# Patient Record
Sex: Male | Born: 1965 | Race: White | Hispanic: No | Marital: Single | Smoking: Former smoker
Health system: Southern US, Community
[De-identification: ages and names within clinical notes are randomized; demographics above are authoritative.]

## PROBLEM LIST (undated history)

## (undated) DIAGNOSIS — R600 Localized edema: Secondary | ICD-10-CM

## (undated) DIAGNOSIS — T8859XA Other complications of anesthesia, initial encounter: Secondary | ICD-10-CM

## (undated) DIAGNOSIS — T4145XA Adverse effect of unspecified anesthetic, initial encounter: Secondary | ICD-10-CM

## (undated) DIAGNOSIS — N529 Male erectile dysfunction, unspecified: Secondary | ICD-10-CM

## (undated) HISTORY — PX: CERVICAL SPINE SURGERY: SHX589

## (undated) HISTORY — DX: Male erectile dysfunction, unspecified: N52.9

## (undated) HISTORY — DX: Localized edema: R60.0

## (undated) HISTORY — PX: HERNIA REPAIR: SHX51

---

## 2004-02-07 ENCOUNTER — Ambulatory Visit: Payer: Self-pay | Admitting: Orthopedic Surgery

## 2004-03-07 ENCOUNTER — Ambulatory Visit (HOSPITAL_COMMUNITY): Admission: RE | Admit: 2004-03-07 | Discharge: 2004-03-08 | Payer: Self-pay | Admitting: Neurological Surgery

## 2004-04-02 ENCOUNTER — Encounter: Admission: RE | Admit: 2004-04-02 | Discharge: 2004-04-02 | Payer: Self-pay | Admitting: Neurological Surgery

## 2005-08-27 IMAGING — CR DG CHEST 2V
2 series · 2 of 2 positions shown · non-contrast
Comparison: none

CLINICAL DATA: 39-year-old with HNP.  Pre-op evaluation.    

 CHEST (TWO VIEWS):
 No prior exams are available for comparison.  
 The heart size and mediastinal contours are normal. The lungs are clear. The visualized skeleton is unremarkable.

[view not recorded (1 of 2)]
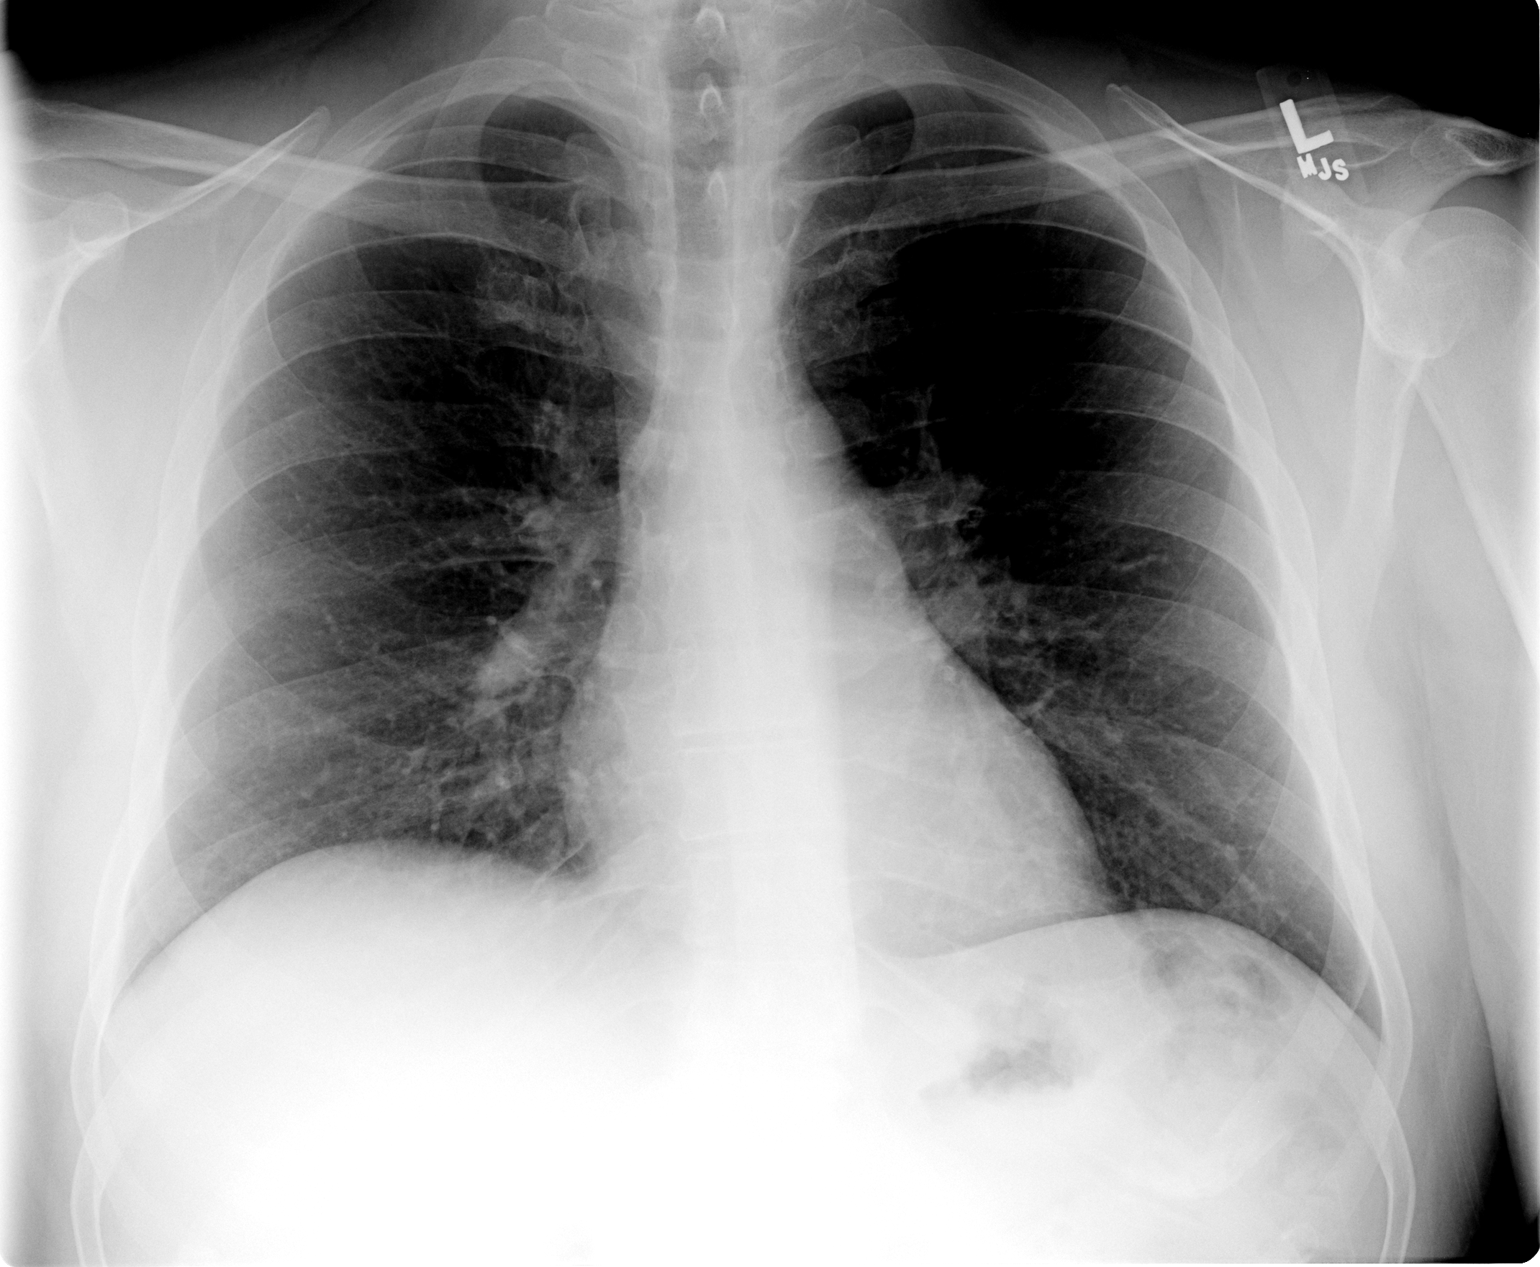

[view not recorded (2 of 2)]
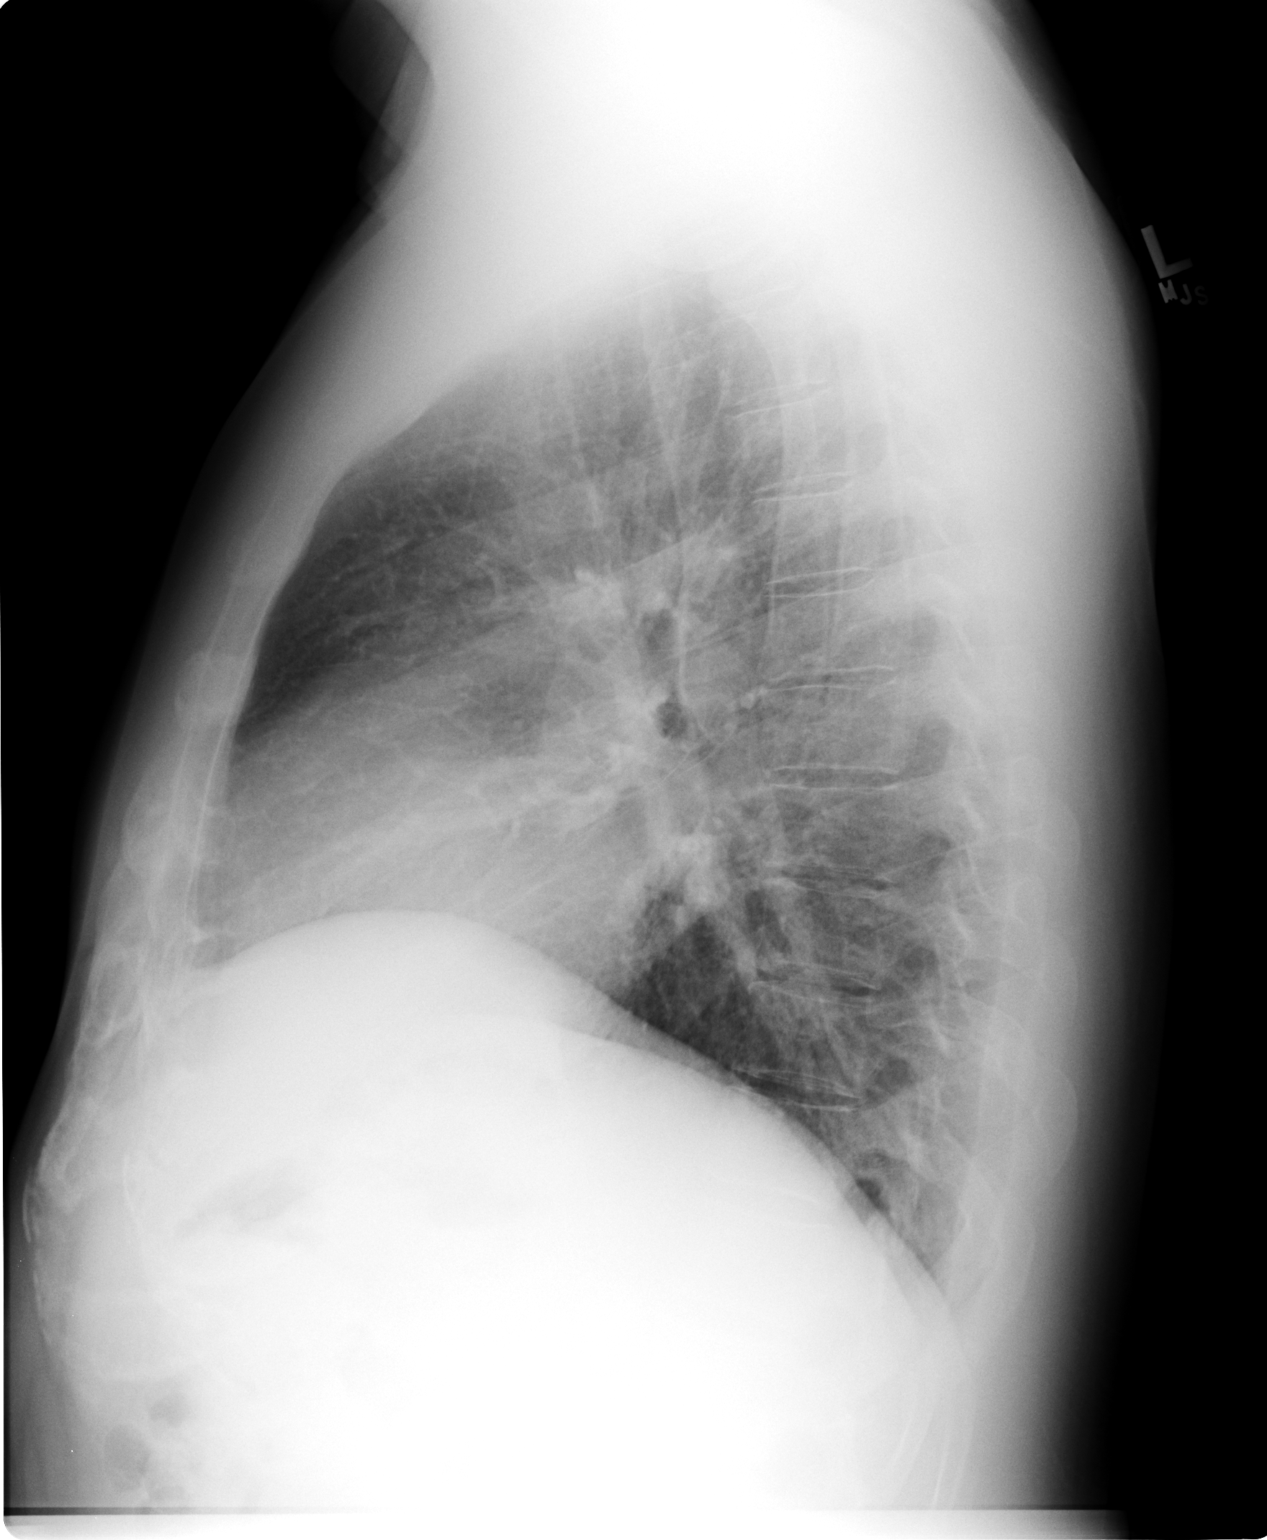

[2 of 2 positions shown; findings below may reference images not displayed]

IMPRESSION: No active disease.

## 2005-09-24 IMAGING — CR DG CERVICAL SPINE 1V
1 series · 1 of 1 positions shown · non-contrast
Comparison: none

CLINICAL DATA: Status post ACDF C5-6. 
 SINGLE VIEW CERVICAL SPINE: 
 Single lateral view of the cervical spine demonstrate anterior plate and screw fixation and interbody fusion at C5-6 with normal alignment.  There appears to be some bony incorporation of the bony fusion anteriorly.  No complicating features identified.

[view not recorded]
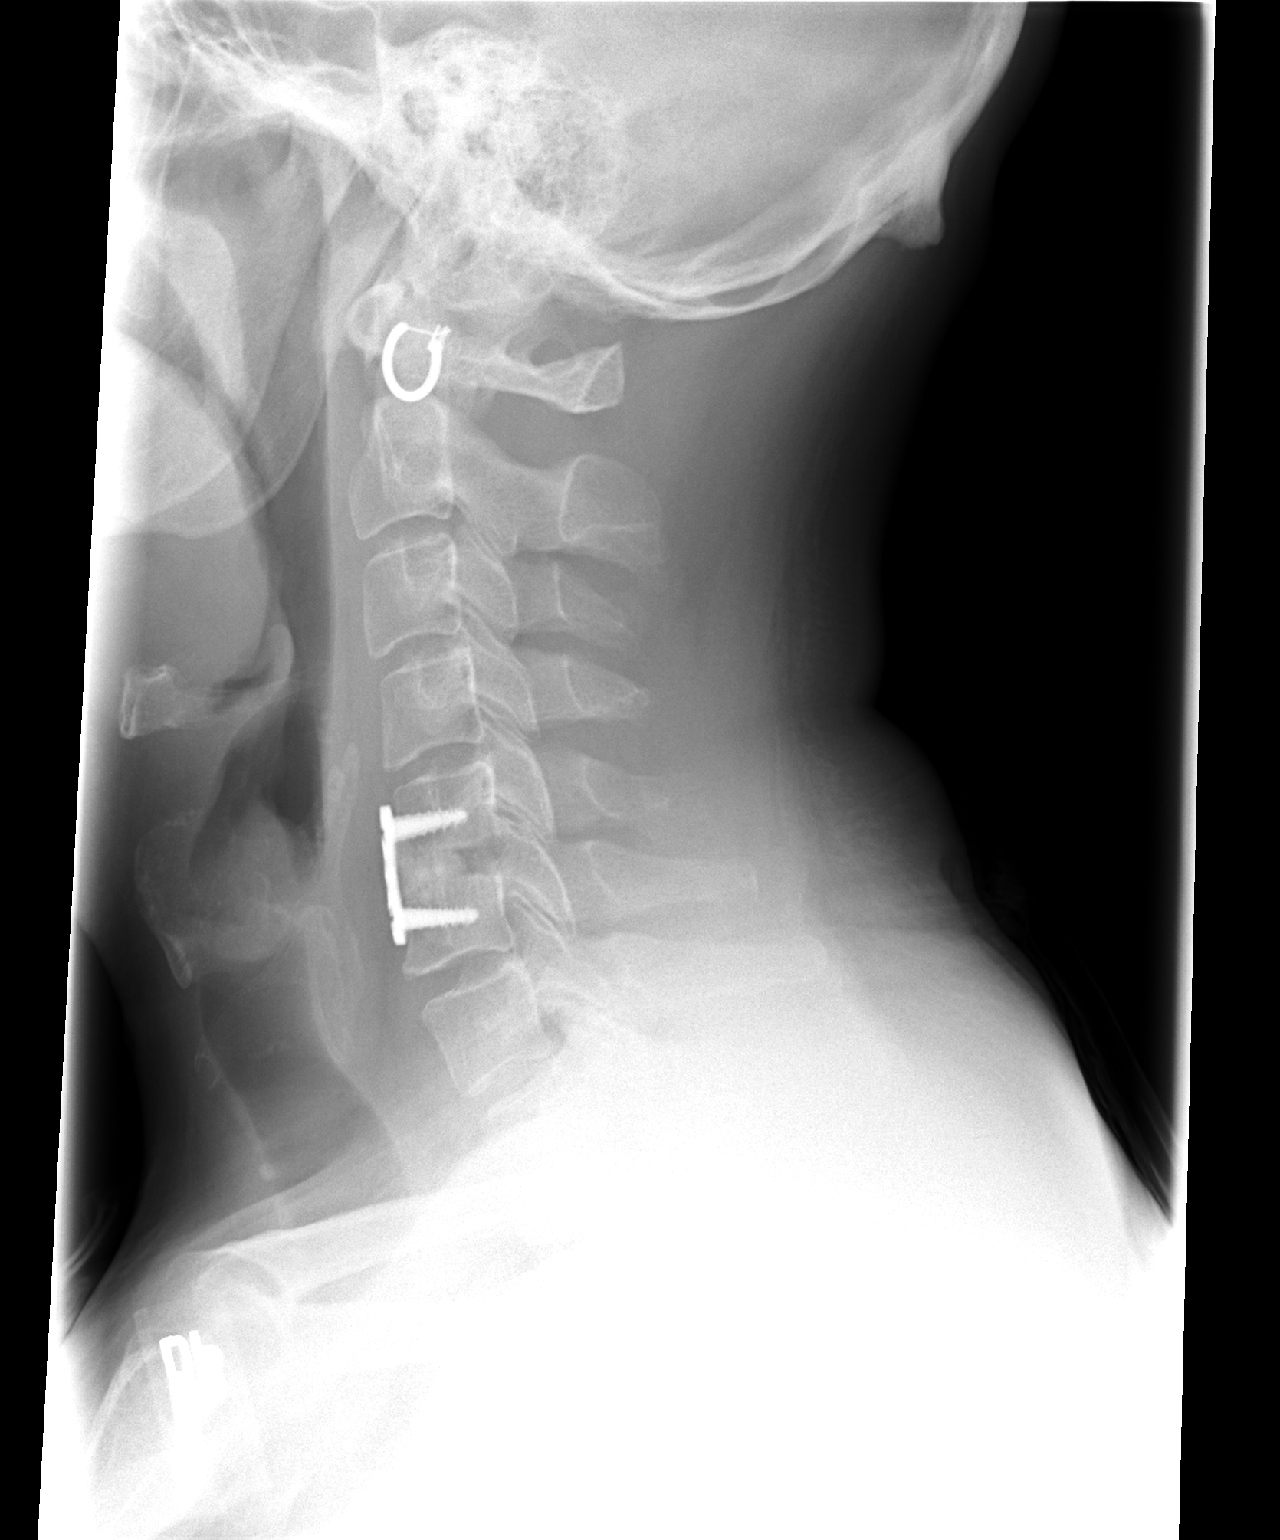

[1 of 1 positions shown; findings below may reference images not displayed]

IMPRESSION: Anterior fusion C5-6 without complicating features.

## 2014-09-01 ENCOUNTER — Other Ambulatory Visit: Payer: Self-pay | Admitting: Family Medicine

## 2014-09-01 ENCOUNTER — Telehealth: Payer: Self-pay | Admitting: Family Medicine

## 2014-09-01 DIAGNOSIS — A6000 Herpesviral infection of urogenital system, unspecified: Secondary | ICD-10-CM

## 2014-09-01 DIAGNOSIS — N529 Male erectile dysfunction, unspecified: Secondary | ICD-10-CM

## 2014-09-01 MED ORDER — TADALAFIL 20 MG PO TABS: 20.0000 mg | ORAL_TABLET | Freq: Every day | ORAL | Status: AC | PRN

## 2014-09-01 MED ORDER — VALACYCLOVIR HCL 500 MG PO TABS: 500.0000 mg | ORAL_TABLET | Freq: Two times a day (BID) | ORAL | Status: DC

## 2014-09-01 NOTE — Telephone Encounter (Signed)
Needs new refills for Valtrex and Cialis.  Uses [facility] .

## 2014-09-01 NOTE — Telephone Encounter (Signed)
review 

## 2014-09-12 ENCOUNTER — Encounter: Payer: Self-pay | Admitting: Family Medicine

## 2014-09-13 ENCOUNTER — Ambulatory Visit (INDEPENDENT_AMBULATORY_CARE_PROVIDER_SITE_OTHER): Payer: BLUE CROSS/BLUE SHIELD | Admitting: Family Medicine

## 2014-09-13 ENCOUNTER — Encounter: Payer: Self-pay | Admitting: Family Medicine

## 2014-09-13 VITALS — BP 124/80 | HR 73 | Temp 98.4°F | Resp 16 | Ht 72.0 in | Wt 270.6 lb

## 2014-09-13 DIAGNOSIS — R635 Abnormal weight gain: Secondary | ICD-10-CM | POA: Diagnosis not present

## 2014-09-13 DIAGNOSIS — Z0000 Encounter for general adult medical examination without abnormal findings: Secondary | ICD-10-CM

## 2014-09-13 NOTE — Progress Notes (Signed)
Subjective:     Patient ID: Shaun Hanson, male   DOB: 11/24/1965, 49 y.o.   MRN: [id number]  HPI  Chief Complaint  Patient presents with  . Annual Exam    Patient is here for his annual physical he would like to address weight gain over the past year and to discuss his liver function. Patient reports that he use to be a heavy drinker drinking up to 20oz a day and has now gone down to 1 drink on weekends. 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 He is planning to buy a dump truck and start a new business.   Review of Systems General: Feeling well ; Defers Tdap. Concerned about weight gain (20# over the last year) HEENT: first dental visit in several years pending due to "broken tooth". Cardiovascular: no chest pain, shortness of breath, or palpitations. Still gets intermittent leg swelling from venous insufficiency but much less since he quit drinking heavily a year ago. GI:  Heartburn 1-2 x weeks treated with TUMS. No change in bowel habits or blood in the stool. Feels bloated at times concerned he may have chronic liver problems. GU: nocturia x 0, no change in bladder habits. Uses Cialis occasionally for erectile dysfunction. 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 Psychiatric: not depressed-PHQ-2: 0 Musculoskeletal: no joint pain    Objective:   Physical Exam Eyes: PERRLA, EOMI. Swelling noted below lower eyelids. Neck: no thyromegaly, tenderness or nodules, carotids palpable-no bruits, no cervical adenopathy ENT: TM's intact without inflammation; No tonsillar enlargement or exudate, Lungs: Clear Heart : RRR without murmur or gallop Abd: bowel sounds present, soft, non-tender, no organomegaly Rectal: prostate exam not attempted due to patient's body  habitus Extremities: no edema Skin: no atypical lesions noted.    Assessment:    1. Annual physical exam - Lipid panel - Comprehensive metabolic panel - PSA - Ambulatory referral to Gastroenterology  2. Increased body weight - T4, free - TSH    Plan:    Further f/u pending lab work

## 2014-09-13 NOTE — Patient Instructions (Signed)
We will call you with lab results.       

## 2014-09-14 ENCOUNTER — Telehealth: Payer: Self-pay

## 2014-09-14 LAB — LIPID PANEL
CHOL/HDL RATIO: 7.2 ratio — AB (ref 0.0–5.0)
Cholesterol, Total: 236 mg/dL — ABNORMAL HIGH (ref 100–199)
HDL: 33 mg/dL — AB (ref >39)
LDL CALC: 157 mg/dL — AB (ref 0–99)
TRIGLYCERIDES: 231 mg/dL — AB (ref 0–149)
VLDL CHOLESTEROL CAL: 46 mg/dL — AB (ref 5–40)

## 2014-09-14 LAB — COMPREHENSIVE METABOLIC PANEL
ALBUMIN: 4.2 g/dL (ref 3.5–5.5)
ALT: 70 IU/L — ABNORMAL HIGH (ref 0–44)
AST: 34 IU/L (ref 0–40)
Albumin/Globulin Ratio: 1.6 (ref 1.1–2.5)
Alkaline Phosphatase: 62 IU/L (ref 39–117)
BUN / CREAT RATIO: 28 — AB (ref 9–20)
BUN: 23 mg/dL (ref 6–24)
Bilirubin Total: 0.8 mg/dL (ref 0.0–1.2)
CHLORIDE: 102 mmol/L (ref 97–108)
CO2: 21 mmol/L (ref 18–29)
Calcium: 9.5 mg/dL (ref 8.7–10.2)
Creatinine, Ser: 0.83 mg/dL (ref 0.76–1.27)
GFR calc Af Amer: 119 mL/min/{1.73_m2} (ref >59)
GFR calc non Af Amer: 103 mL/min/{1.73_m2} (ref >59)
GLUCOSE: 99 mg/dL (ref 65–99)
Globulin, Total: 2.7 g/dL (ref 1.5–4.5)
POTASSIUM: 4.6 mmol/L (ref 3.5–5.2)
SODIUM: 140 mmol/L (ref 134–144)
Total Protein: 6.9 g/dL (ref 6.0–8.5)

## 2014-09-14 LAB — TSH: TSH: 2.56 u[IU]/mL (ref 0.450–4.500)

## 2014-09-14 LAB — T4, FREE: FREE T4: 1.21 ng/dL (ref 0.82–1.77)

## 2014-09-14 LAB — PSA: PROSTATE SPECIFIC AG, SERUM: 0.2 ng/mL (ref 0.0–4.0)

## 2014-09-14 NOTE — Telephone Encounter (Signed)
-----   Message from Carmon Ginsberg, [place] sent at 09/14/2014 12:37 PM EDT ----- Cholesterol is elevated but your calculated 10 year risk for developing cardiovascular disease is 6.4 % below the 7.5 % thresh hold for starting medication. One of your liver tests remains mildly elevated from last year. If we have not performed an ultrasound to check your liver would recommend this due to your stated concerns about your prior alcohol use.. All your other labs are ok including thyroid and prostate blood test.

## 2014-09-14 NOTE — Telephone Encounter (Signed)
[id number] 

## 2014-09-21 NOTE — Telephone Encounter (Signed)
No medication I am aware of to repair liver. Avoid alcohol, tylenol, and minimal use of ibuprofen or similar.

## 2014-09-21 NOTE — Telephone Encounter (Signed)
Ultrasound may help detect cirrhosis or liver enlargement. Request for colonoscopy was sent at time of his visit. Our referral person was out for a week so it still catching up.

## 2014-09-21 NOTE — Telephone Encounter (Signed)
Spoke with patient on the phone who states in past his liver was enlarged he believes that it probably is still enlarged and feels no need at this time to have ultrasound done. Patient wants to know if there is a medication that can be prescribed to help repair liver?

## 2014-09-21 NOTE — Telephone Encounter (Signed)
Patient was advised, he believes that if he stays on current regimen of decreasing alcohol that his liver function will return back to normal. Advised patient that ultrasound is recommend, patient request more information as to what would you exactly be looking for when and if he decides to get ultrasound? Patient also inquired if colonoscopy order has been placed?

## 2014-09-21 NOTE — Telephone Encounter (Signed)
Okay per patient to leave detailed voicemail, left message stating to avoid alcohol, tylenol and minimal use of ibuprofen. KW

## 2014-09-26 ENCOUNTER — Telehealth: Payer: Self-pay | Admitting: Gastroenterology

## 2014-09-26 ENCOUNTER — Other Ambulatory Visit: Payer: Self-pay

## 2014-09-26 NOTE — Telephone Encounter (Signed)
Gastroenterology Pre-Procedure Review  Request Date: 10-10-2014 Requesting Physician: Dr.   PATIENT REVIEW QUESTIONS: The patient responded to the following health history questions as indicated:    1. Are you having any GI issues? no 2. Do you have a personal history of Polyps? no 3. Do you have a family history of Colon Cancer or Polyps? no 4. Diabetes Mellitus? no 5. Joint replacements in the past 12 months?no 6. Major health problems in the past 3 months?no 7. Any artificial heart valves, MVP, or defibrillator?no    MEDICATIONS & ALLERGIES:    Patient reports the following regarding taking any anticoagulation/antiplatelet therapy:   Plavix, Coumadin, Eliquis, Xarelto, Lovenox, Pradaxa, Brilinta, or Effient? no Aspirin? no  Patient confirms/reports the following medications:  Current Outpatient Prescriptions  Medication Sig Dispense Refill   tadalafil (CIALIS) 20 MG tablet Take 1 tablet (20 mg total) by mouth daily as needed for erectile dysfunction. 6 tablet 11   valACYclovir (VALTREX) 500 MG tablet Take 1 tablet (500 mg total) by mouth 2 (two) times daily. As needed for flares 30 tablet 2   No current facility-administered medications for this visit.    Patient confirms/reports the following allergies: N/A No Known Allergies  No orders of the defined types were placed in this encounter.    AUTHORIZATION INFORMATION Primary Insurance: 1D#: Group #:  Secondary Insurance: 1D#: Group #:  SCHEDULE INFORMATION: Date: 10-10-2014 Time: Location:[facility]

## 2014-10-07 NOTE — Discharge Instructions (Signed)

## 2014-10-10 ENCOUNTER — Ambulatory Visit: Payer: BLUE CROSS/BLUE SHIELD | Admitting: Anesthesiology

## 2014-10-10 ENCOUNTER — Encounter
Admission: RE | Disposition: A | Discharge status: Home / Self Care | Payer: Self-pay | Source: Ambulatory Visit | Attending: Gastroenterology

## 2014-10-10 ENCOUNTER — Ambulatory Visit
Admission: RE | Admit: 2014-10-10 | Discharge: 2014-10-10 | Disposition: A | Discharge status: Home / Self Care | Payer: BLUE CROSS/BLUE SHIELD | Source: Ambulatory Visit | Attending: Gastroenterology | Admitting: Gastroenterology

## 2014-10-10 ENCOUNTER — Other Ambulatory Visit: Payer: Self-pay | Admitting: Gastroenterology

## 2014-10-10 DIAGNOSIS — Z79899 Other long term (current) drug therapy: Secondary | ICD-10-CM | POA: Diagnosis not present

## 2014-10-10 DIAGNOSIS — N529 Male erectile dysfunction, unspecified: Secondary | ICD-10-CM | POA: Insufficient documentation

## 2014-10-10 DIAGNOSIS — E669 Obesity, unspecified: Secondary | ICD-10-CM | POA: Insufficient documentation

## 2014-10-10 DIAGNOSIS — Z9889 Other specified postprocedural states: Secondary | ICD-10-CM | POA: Insufficient documentation

## 2014-10-10 DIAGNOSIS — Z1211 Encounter for screening for malignant neoplasm of colon: Secondary | ICD-10-CM | POA: Insufficient documentation

## 2014-10-10 DIAGNOSIS — D123 Benign neoplasm of transverse colon: Secondary | ICD-10-CM | POA: Insufficient documentation

## 2014-10-10 DIAGNOSIS — Z6835 Body mass index (BMI) 35.0-35.9, adult: Secondary | ICD-10-CM | POA: Diagnosis not present

## 2014-10-10 HISTORY — PX: POLYPECTOMY: SHX5525

## 2014-10-10 HISTORY — DX: Adverse effect of unspecified anesthetic, initial encounter: T41.45XA

## 2014-10-10 HISTORY — DX: Other complications of anesthesia, initial encounter: T88.59XA

## 2014-10-10 HISTORY — PX: COLONOSCOPY WITH PROPOFOL: SHX5780

## 2014-10-10 SURGERY — COLONOSCOPY WITH PROPOFOL
Anesthesia: Monitor Anesthesia Care | Wound class: Contaminated

## 2014-10-10 MED ORDER — ACETAMINOPHEN 325 MG PO TABS: 325.0000 mg | ORAL_TABLET | ORAL | Status: DC | PRN

## 2014-10-10 MED ORDER — LIDOCAINE HCL (CARDIAC) 20 MG/ML IV SOLN
INTRAVENOUS | Status: DC | PRN
  Administered 2014-10-10: 40 mg via INTRAVENOUS

## 2014-10-10 MED ORDER — ACETAMINOPHEN 160 MG/5ML PO SOLN: 325.0000 mg | ORAL | Status: DC | PRN

## 2014-10-10 MED ORDER — LACTATED RINGERS IV SOLN
INTRAVENOUS | Status: DC
  Administered 2014-10-10: 10:00:00 via INTRAVENOUS

## 2014-10-10 MED ORDER — SODIUM CHLORIDE 0.9 % IV SOLN: INTRAVENOUS | Status: DC

## 2014-10-10 MED ORDER — PROPOFOL 10 MG/ML IV BOLUS
INTRAVENOUS | Status: DC | PRN
  Administered 2014-10-10: 100 mg via INTRAVENOUS
  Administered 2014-10-10 (×2): 50 mg via INTRAVENOUS
  Administered 2014-10-10: 100 mg via INTRAVENOUS

## 2014-10-10 SURGICAL SUPPLY — 28 items
CANISTER SUCT 1200ML W/VALVE (MISCELLANEOUS) ×3 IMPLANT
FCP ESCP3.2XJMB 240X2.8X (MISCELLANEOUS)
FORCEPS BIOP RAD 4 LRG CAP 4 (CUTTING FORCEPS) ×3 IMPLANT
FORCEPS BIOP RJ4 240 W/NDL (MISCELLANEOUS)
FORCEPS ESCP3.2XJMB 240X2.8X (MISCELLANEOUS) IMPLANT
GOWN CVR UNV OPN BCK APRN NK (MISCELLANEOUS) ×2 IMPLANT
GOWN ISOL THUMB LOOP REG UNIV (MISCELLANEOUS) ×4
HEMOCLIP INSTINCT (CLIP) IMPLANT
INJECTOR VARIJECT VIN23 (MISCELLANEOUS) IMPLANT
KIT CO2 TUBING (TUBING) IMPLANT
KIT DEFENDO VALVE AND CONN (KITS) IMPLANT
KIT ENDO PROCEDURE OLY (KITS) ×3 IMPLANT
LIGATOR MULTIBAND 6SHOOTER MBL (MISCELLANEOUS) IMPLANT
MARKER SPOT ENDO TATTOO 5ML (MISCELLANEOUS) IMPLANT
PAD GROUND ADULT SPLIT (MISCELLANEOUS) IMPLANT
SNARE SHORT THROW 13M SML OVAL (MISCELLANEOUS) IMPLANT
SNARE SHORT THROW 30M LRG OVAL (MISCELLANEOUS) IMPLANT
SPOT EX ENDOSCOPIC TATTOO (MISCELLANEOUS)
SUCTION POLY TRAP 4CHAMBER (MISCELLANEOUS) IMPLANT
TRAP SUCTION POLY (MISCELLANEOUS) IMPLANT
TUBING CONN 6MMX3.1M (TUBING)
TUBING SUCTION CONN 0.25 STRL (TUBING) IMPLANT
UNDERPAD 30X60 958B10 (PK) (MISCELLANEOUS) IMPLANT
VALVE BIOPSY ENDO (VALVE) IMPLANT
VARIJECT INJECTOR VIN23 (MISCELLANEOUS)
WATER AUXILLARY (MISCELLANEOUS) IMPLANT
WATER STERILE IRR 250ML POUR (IV SOLUTION) ×3 IMPLANT
WATER STERILE IRR 500ML POUR (IV SOLUTION) IMPLANT

## 2014-10-10 NOTE — Op Note (Signed)
[facility]astroenter[facility] Patient Name: Shaun Hanson Procedure Date: 10/10/2014 11:29 AM MRN: [id number] Account #: [id number] Date of Birth: 03/10/1965 Admit Type: Outpatient Age: 49 Room: [facility] OR ROOM 01 Gender: Male Note Status: Finalized Procedure:         Colonoscopy Indications:       Screening for colorectal malignant neoplasm Providers:         Lucilla Lame, MD Referring MD:      Rose Fillers. Jaynie Crumble, MD (Referring MD) Medicines:         Propofol per Anesthesia Complications:     No immediate complications. Procedure:         Pre-Anesthesia Assessment:                    - Prior to the procedure, a History and Physical was                     performed, and patient medications and allergies were                     reviewed. The patient's tolerance of previous anesthesia                     was also reviewed. The risks and benefits of the procedure                     and the sedation options and risks were discussed with the                     patient. All questions were answered, and informed consent                     was obtained. Prior Anticoagulants: The patient has taken                     no previous anticoagulant or antiplatelet agents. ASA                     Grade Assessment: II - A patient with mild systemic                     disease. After reviewing the risks and benefits, the                     patient was deemed in satisfactory condition to undergo                     the procedure.                    After obtaining informed consent, the colonoscope was                     passed under direct vision. Throughout the procedure, the                     patient's blood pressure, pulse, and oxygen saturations                     were monitored continuously. The Olympus CF H180AL                     colonoscope (S#: [id number]) was introduced through the anus  and advanced to the the cecum, identified by appendiceal            orifice and ileocecal valve. The colonoscopy was performed                     without difficulty. The patient tolerated the procedure                     well. The quality of the bowel preparation was excellent. Findings:      The perianal and digital rectal examinations were normal.      A 5 mm polyp was found in the transverse colon. The polyp was sessile.       The polyp was removed with a cold biopsy forceps. Resection and       retrieval were complete.      The exam was otherwise without abnormality. Impression:        - One 5 mm polyp in the transverse colon. Resected and                     retrieved.                    - The examination was otherwise normal. Recommendation:    - Repeat colonoscopy in 5 years if polyp adenoma and 10                     years if hyperplastic Procedure Code(s): --- Professional ---                    [phone], Colonoscopy, flexible; with biopsy, single or                     multiple Diagnosis Code(s): --- Professional ---                    Z12.11, Encounter for screening for malignant neoplasm of                     colon                    D12.3, Benign neoplasm of transverse colon CPT copyright 2014 American Medical Association. All rights reserved. The codes documented in this report are preliminary and upon coder review may  be revised to meet current compliance requirements. Lucilla Lame, MD 10/10/2014 11:45:06 AM This report has been signed electronically. Number of Addenda: 0 Note Initiated On: 10/10/2014 11:29 AM Scope Withdrawal Time: 0 hours 4 minutes 49 seconds  Total Procedure Duration: 0 hours 8 minutes 35 seconds       [facility]

## 2014-10-10 NOTE — Transfer of Care (Signed)
Immediate Anesthesia Transfer of Care Note  Patient: Shaun Hanson  Procedure(s) Performed: Procedure(s) with comments: COLONOSCOPY WITH PROPOFOL (N/A) POLYPECTOMY (N/A) - transverse colon polyp  Patient Location: PACU  Anesthesia Type: MAC  Level of Consciousness: awake, alert  and patient cooperative  Airway and Oxygen Therapy: Patient Spontanous Breathing and Patient connected to supplemental oxygen  Post-op Assessment: Post-op Vital signs reviewed, Patient's Cardiovascular Status Stable, Respiratory Function Stable, Patent Airway and No signs of Nausea or vomiting  Post-op Vital Signs: Reviewed and stable  Complications: No apparent anesthesia complications

## 2014-10-10 NOTE — Anesthesia Postprocedure Evaluation (Signed)
  Anesthesia Post-op Note  Patient: Shaun Hanson  Procedure(s) Performed: Procedure(s) with comments: COLONOSCOPY WITH PROPOFOL (N/A) POLYPECTOMY (N/A) - transverse colon polyp  Anesthesia type:MAC  Patient location: PACU  Post pain: Pain level controlled  Post assessment: Post-op Vital signs reviewed, Patient's Cardiovascular Status Stable, Respiratory Function Stable, Patent Airway and No signs of Nausea or vomiting  Post vital signs: Reviewed and stable  Last Vitals:  Filed Vitals:   10/10/14 1200  BP: 104/89  Pulse: 71  Temp:   Resp: 16    Level of consciousness: awake, alert  and patient cooperative  Complications: No apparent anesthesia complications

## 2014-10-10 NOTE — Anesthesia Preprocedure Evaluation (Signed)
Anesthesia Evaluation  Patient identified by MRN, date of birth, ID band  Reviewed: Allergy & Precautions, H&P , NPO status , Patient's Chart, lab work & pertinent test results  Airway Mallampati: II  TM Distance: >3 FB Neck ROM: full    Dental no notable dental hx.    Pulmonary former smoker,    Pulmonary exam normal       Cardiovascular Rhythm:regular Rate:Normal     Neuro/Psych    GI/Hepatic   Endo/Other    Renal/GU      Musculoskeletal   Abdominal (+) + obese,   Peds  Hematology   Anesthesia Other Findings   Reproductive/Obstetrics                             Anesthesia Physical Anesthesia Plan  ASA: II  Anesthesia Plan: MAC   Post-op Pain Management:    Induction:   Airway Management Planned:   Additional Equipment:   Intra-op Plan:   Post-operative Plan:   Informed Consent: I have reviewed the patients History and Physical, chart, labs and discussed the procedure including the risks, benefits and alternatives for the proposed anesthesia with the patient or authorized representative who has indicated his/her understanding and acceptance.     Plan Discussed with: CRNA  Anesthesia Plan Comments:         Anesthesia Quick Evaluation

## 2014-10-10 NOTE — Anesthesia Procedure Notes (Signed)
Procedure Name: MAC Performed by: Rudolf Blizard Pre-anesthesia Checklist: Patient identified, Emergency Drugs available, Suction available, Timeout performed and Patient being monitored Patient Re-evaluated:Patient Re-evaluated prior to inductionOxygen Delivery Method: Nasal cannula Placement Confirmation: positive ETCO2     

## 2014-10-10 NOTE — H&P (Signed)
  [facility]  [address], [place], [place] [address] Phone: [phone] Fax : [phone]  Primary Care Physician:  Carmon Ginsberg, [place] Primary Gastroenterologist:  Dr. Allen Norris  Pre-Procedure History & Physical: HPI:  Shaun Hanson is a 49 y.o. male is here for a screening colonoscopy.   Past Medical History  Diagnosis Date  . Erectile dysfunction   . Leg edema, right   . Complication of anesthesia     WOKE UP DURING HERNIA SURGERY    Past Surgical History  Procedure Laterality Date  . Cervical spine surgery    . Hernia repair      UMBILICAL SURGERY    Prior to Admission medications   Medication Sig Start Date End Date Taking? Authorizing Provider  tadalafil (CIALIS) 20 MG tablet Take 1 tablet (20 mg total) by mouth daily as needed for erectile dysfunction. 09/01/14  Yes Carmon Ginsberg, PA  valACYclovir (VALTREX) 500 MG tablet Take 1 tablet (500 mg total) by mouth 2 (two) times daily. As needed for flares 09/01/14  Yes Carmon Ginsberg, [place]    Allergies as of 09/26/2014  . (No Known Allergies)    History reviewed. No pertinent family history.  Social History   Social History  . Marital Status: Divorced    Spouse Name: N/A  . Number of Children: N/A  . Years of Education: N/A   Occupational History  . Not on file.   Social History Main Topics  . Smoking status: Former Smoker -- 0.50 packs/day for 10 years    Types: Cigarettes  . Smokeless tobacco: Not on file  . Alcohol Use: 0.0 oz/week    0 Standard drinks or equivalent per week     Comment: past history of heavy alcohol use, patient was on 20oz a night and is now to a drink over weekend.   . Drug Use: Not on file  . Sexual Activity: Not on file   Other Topics Concern  . Not on file   Social History Narrative    Review of Systems: See HPI, otherwise negative ROS  Physical Exam: BP 115/77 mmHg  Pulse 63  Temp(Src) 98.2 F (36.8 C) (Temporal)  Resp 16  Ht 6' (1.829 m)  Wt 265  lb (120.203 kg)  BMI 35.93 kg/m2  SpO2 98% General:   Alert,  pleasant and cooperative in NAD Head:  Normocephalic and atraumatic. Neck:  Supple; no masses or thyromegaly. Lungs:  Clear throughout to auscultation.    Heart:  Regular rate and rhythm. Abdomen:  Soft, nontender and nondistended. Normal bowel sounds, without guarding, and without rebound.   Neurologic:  Alert and  oriented x4;  grossly normal neurologically.  Impression/Plan: Shaun Hanson is now here to undergo a screening colonoscopy.  Risks, benefits, and alternatives regarding colonoscopy have been reviewed with the patient.  Questions have been answered.  All parties agreeable.

## 2014-10-11 ENCOUNTER — Encounter: Payer: Self-pay | Admitting: Gastroenterology

## 2014-10-17 ENCOUNTER — Encounter: Payer: Self-pay | Admitting: Gastroenterology

## 2014-10-21 ENCOUNTER — Telehealth: Payer: Self-pay | Admitting: Family Medicine

## 2014-10-21 NOTE — Telephone Encounter (Signed)
Could protein in the urine factor in to lower back pain?

## 2014-10-21 NOTE — Telephone Encounter (Signed)
Discussed office visit next week to repeat urine.

## 2014-10-21 NOTE — Telephone Encounter (Signed)
Having a little dull pain in his right kidney.  He said we told him he had some protein in his urine when he was in and he doesn't know if this is related or not  He said he could not come in today.  His call back   [phone].  Thanks [employer]

## 2015-02-21 ENCOUNTER — Other Ambulatory Visit: Payer: Self-pay | Admitting: Family Medicine

## 2015-03-20 ENCOUNTER — Other Ambulatory Visit: Payer: Self-pay | Admitting: Family Medicine

## 2015-05-21 ENCOUNTER — Other Ambulatory Visit: Payer: Self-pay | Admitting: Family Medicine

## 2023-12-02 ENCOUNTER — Encounter: Payer: Self-pay | Admitting: Dermatology

## 2023-12-02 ENCOUNTER — Ambulatory Visit: Admitting: Dermatology

## 2023-12-02 DIAGNOSIS — S0001XA Abrasion of scalp, initial encounter: Secondary | ICD-10-CM | POA: Diagnosis not present

## 2023-12-02 DIAGNOSIS — T148XXA Other injury of unspecified body region, initial encounter: Secondary | ICD-10-CM

## 2023-12-02 DIAGNOSIS — R238 Other skin changes: Secondary | ICD-10-CM

## 2023-12-02 DIAGNOSIS — L739 Follicular disorder, unspecified: Secondary | ICD-10-CM

## 2023-12-02 MED ORDER — DOXYCYCLINE MONOHYDRATE 100 MG PO TABS: 100.0000 mg | ORAL_TABLET | Freq: Two times a day (BID) | ORAL | 0 refills | Status: DC

## 2023-12-02 NOTE — Progress Notes (Unsigned)
   Follow-Up Visit   Subjective  Shaun Hanson is a 58 y.o. male who presents for the following: Areas of concern on the scalp. Has been dx'd as folliculitis in the past. Not currently being treated. Lesions begin as bumps and then open up. Sometimes have oily drainage. Can last for a month. Has tried many topical treatments without success. Has not tried oral treatments   The following portions of the chart were reviewed this encounter and updated as appropriate: medications, allergies, medical history  Review of Systems:  No other skin or systemic complaints except as noted in HPI or Assessment and Plan.  Objective  Well appearing patient in no apparent distress; mood and affect are within normal limits.   A focused examination was performed of the following areas: Scalp.   Relevant exam findings are noted in the Assessment and Plan.    Assessment & Plan   Excoriated crusted papules with history concerning for folliculitis Exam: few scattered excoriated papules on the scalp face upper extremities  Folliculitis occurs due to inflammation of the superficial hair follicle (pore), resulting in acne-like lesions (pus bumps). It can be infectious (bacterial, fungal) or noninfectious (shaving, tight clothing, heat/sweat, medications).  Folliculitis can be acute or chronic and recommended treatment depends on the underlying cause of folliculitis.  Treatment Plan: - offered biopsy of lesion but current lesions are clinically suggestive of excoriation. Suspect biopsy would show trauma without a primary process. Patient opts for empiric treatment of folliculitis - Start doxycycline 100mg  twice daily for one month - Discussed side effects and precautions with doxycycline including taking with  meal, waiting at least 30 minutes before lying down at night, increased sun sensitivity, and to stop medication if becomes pregnant or breastfeeding - patient will contact us  for same day appointment if  new lesion develops. Discussed that it must be a lesion that has not yet opened up, else biopsy will likely show open wound   EXCORIATION   FOLLICULITIS   Related Medications doxycycline (ADOXA) 100 MG tablet Take 1 tablet (100 mg total) by mouth 2 (two) times daily. Take 100 mg twice daily for one month  Return in about 1 month (around 01/02/2024) for Folliculitis .  I, Emerick Ege, CMA am acting as scribe for Boneta Sharps, MD.   Documentation: I have reviewed the above documentation for accuracy and completeness, and I agree with the above.  Boneta Sharps, MD

## 2023-12-02 NOTE — Patient Instructions (Addendum)
 Doxycycline  should be taken with food to prevent nausea. Do not lay down for 30 minutes after taking. Be cautious with sun exposure and use good sun protection while on this medication. Pregnant women should not take this medication.    Due to recent changes in healthcare laws, you may see results of your pathology and/or laboratory studies on MyChart before the doctors have had a chance to review them. We understand that in some cases there may be results that are confusing or concerning to you. Please understand that not all results are received at the same time and often the doctors may need to interpret multiple results in order to provide you with the best plan of care or course of treatment. Therefore, we ask that you please give us  2 business days to thoroughly review all your results before contacting the office for clarification. Should we see a critical lab result, you will be contacted sooner.   If You Need Anything After Your Visit  If you have any questions or concerns for your doctor, please call our main line at [phone] and press option 4 to reach your doctor's medical assistant. If no one answers, please leave a voicemail as directed and we will return your call as soon as possible. Messages left after 4 pm will be answered the following business day.   You may also send us  a message via MyChart. We typically respond to MyChart messages within 1-2 business days.  For prescription refills, please ask your pharmacy to contact our office. Our fax number is [phone].  If you have an urgent issue when the clinic is closed that cannot wait until the next business day, you can page your doctor at the number below.    Please note that while we do our best to be available for urgent issues outside of office hours, we are not available 24/7.   If you have an urgent issue and are unable to reach us , you may choose to seek medical care at your doctor's office, retail clinic, urgent  care center, or emergency room.  If you have a medical emergency, please immediately call 911 or go to the emergency department.  Pager Numbers  - Dr. Hester: [phone]  - Dr. Jackquline: [phone]  - Dr. Claudene: [phone]   - Dr. Raymund: [phone]  In the event of inclement weather, please call our main line at [phone] for an update on the status of any delays or closures.  Dermatology Medication Tips: Please keep the boxes that topical medications come in in order to help keep track of the instructions about where and how to use these. Pharmacies typically print the medication instructions only on the boxes and not directly on the medication tubes.   If your medication is too expensive, please contact our office at [phone] option 4 or send us  a message through MyChart.   We are unable to tell what your co-pay for medications will be in advance as this is different depending on your insurance coverage. However, we may be able to find a substitute medication at lower cost or fill out paperwork to get insurance to cover a needed medication.   If a prior authorization is required to get your medication covered by your insurance company, please allow us  1-2 business days to complete this process.  Drug prices often vary depending on where the prescription is filled and some pharmacies may offer cheaper prices.  The website www.goodrx.com contains coupons for medications through different pharmacies. The prices  here do not account for what the cost may be with help from insurance (it may be cheaper with your insurance), but the website can give you the price if you did not use any insurance.  - You can print the associated coupon and take it with your prescription to the pharmacy.  - You may also stop by our office during regular business hours and pick up a GoodRx coupon card.  - If you need your prescription sent electronically to a different pharmacy, notify our office  through [facility] or by phone at [phone] option 4.     Si Usted Necesita Algo Despus de Su Visita  Tambin puede enviarnos un mensaje a travs de Clinical cytogeneticist. Por lo general respondemos a los mensajes de MyChart en el transcurso de 1 a 2 das hbiles.  Para renovar recetas, por favor pida a su farmacia que se ponga en contacto con nuestra oficina. Randi lakes de fax es [place] [phone].  Si tiene un asunto urgente cuando la clnica est cerrada y que no puede esperar hasta el siguiente da hbil, puede llamar/localizar a su doctor(a) al nmero que aparece a continuacin.   Por favor, tenga en cuenta que aunque hacemos todo lo posible para estar disponibles para asuntos urgentes fuera del horario de [place], no estamos disponibles las 24 horas del da, los 7 [address]  [address] de la [place].   Si tiene un problema urgente y no puede comunicarse con nosotros, puede optar por buscar atencin mdica  en el consultorio de su doctor(a), en una clnica privada, en un centro de atencin urgente o en una sala de emergencias.  Si tiene Engineer, drilling, por favor llame inmediatamente al 911 o vaya a la sala de emergencias.  Nmeros de bper  - Dr. Hester: [phone]  - Dra. Jackquline: [phone]  - Dr. Claudene: [phone]  - Dra. Kitts: [phone]  En caso de inclemencias del [place], por favor llame a nuestra lnea principal al [phone] para una actualizacin sobre el estado de cualquier retraso o cierre.  Consejos para la medicacin en dermatologa: Por favor, guarde las cajas en las que vienen los medicamentos de uso tpico para ayudarle a seguir las instrucciones sobre dnde y cmo usarlos. Las farmacias generalmente imprimen las instrucciones del medicamento slo en las cajas y no directamente en los tubos del [place].   Si su medicamento es muy caro, por favor, pngase en contacto con landry rieger llamando al [phone] y presione la opcin 4 o envenos un mensaje  a travs de Clinical cytogeneticist.   No podemos decirle cul ser su copago por los medicamentos por adelantado ya que esto es diferente dependiendo de la cobertura de su seguro. Sin embargo, es posible que podamos encontrar un medicamento sustituto a Audiological scientist un formulario para que el seguro cubra el medicamento que se considera necesario.   Si se requiere una autorizacin previa para que su compaa de seguros malta su medicamento, por favor permtanos de 1 a 2 das hbiles para completar este proceso.  Los precios de los medicamentos varan con frecuencia dependiendo del Environmental consultant de dnde se surte la receta y alguna farmacias pueden ofrecer precios ms baratos.  El sitio web www.goodrx.com tiene cupones para medicamentos de Health and safety inspector. Los precios aqu no tienen en cuenta lo que podra costar con la ayuda del seguro (puede ser ms barato con su seguro), pero el sitio web puede darle el precio si no utiliz Tourist information centre manager.  - Puede imprimir el cupn correspondiente y llevarlo  con su receta a la farmacia.  - Tambin puede pasar por nuestra oficina durante el horario de atencin regular y Education officer, museum una tarjeta de cupones de GoodRx.  - Si necesita que su receta se enve electrnicamente a una farmacia diferente, informe a nuestra oficina a travs de MyChart de [place] o por telfono llamando al [phone] y presione la opcin 4.

## 2023-12-03 ENCOUNTER — Encounter: Payer: Self-pay | Admitting: Dermatology

## 2023-12-29 ENCOUNTER — Encounter: Payer: Self-pay | Admitting: Family Medicine

## 2023-12-29 ENCOUNTER — Ambulatory Visit (INDEPENDENT_AMBULATORY_CARE_PROVIDER_SITE_OTHER): Admitting: Family Medicine

## 2023-12-29 VITALS — BP 131/80 | HR 70 | Temp 98.0°F | Ht 72.0 in | Wt 265.8 lb

## 2023-12-29 DIAGNOSIS — Z1211 Encounter for screening for malignant neoplasm of colon: Secondary | ICD-10-CM

## 2023-12-29 DIAGNOSIS — Z86018 Personal history of other benign neoplasm: Secondary | ICD-10-CM

## 2023-12-29 DIAGNOSIS — Z130 Encounter for screening for diseases of the blood and blood-forming organs and certain disorders involving the immune mechanism: Secondary | ICD-10-CM

## 2023-12-29 DIAGNOSIS — Z136 Encounter for screening for cardiovascular disorders: Secondary | ICD-10-CM

## 2023-12-29 DIAGNOSIS — Z7689 Persons encountering health services in other specified circumstances: Secondary | ICD-10-CM

## 2023-12-29 DIAGNOSIS — M9262 Juvenile osteochondrosis of tarsus, left ankle: Secondary | ICD-10-CM

## 2023-12-29 DIAGNOSIS — A6000 Herpesviral infection of urogenital system, unspecified: Secondary | ICD-10-CM

## 2023-12-29 DIAGNOSIS — E782 Mixed hyperlipidemia: Secondary | ICD-10-CM

## 2023-12-29 DIAGNOSIS — K648 Other hemorrhoids: Secondary | ICD-10-CM | POA: Diagnosis not present

## 2023-12-29 DIAGNOSIS — R600 Localized edema: Secondary | ICD-10-CM

## 2023-12-29 DIAGNOSIS — R7401 Elevation of levels of liver transaminase levels: Secondary | ICD-10-CM

## 2023-12-29 DIAGNOSIS — F1010 Alcohol abuse, uncomplicated: Secondary | ICD-10-CM

## 2023-12-29 DIAGNOSIS — Z125 Encounter for screening for malignant neoplasm of prostate: Secondary | ICD-10-CM

## 2023-12-29 MED ORDER — FUROSEMIDE 20 MG PO TABS: 20.0000 mg | ORAL_TABLET | Freq: Every day | ORAL | 0 refills | Status: AC | PRN

## 2023-12-29 MED ORDER — VALACYCLOVIR HCL 500 MG PO TABS: 500.0000 mg | ORAL_TABLET | Freq: Two times a day (BID) | ORAL | 2 refills | Status: AC

## 2023-12-29 NOTE — Patient Instructions (Signed)
 [email or web address]-[email or web address]/en/encyclopedia-[email or web address]-pain/[email or web address]-[email or web address]/exercises/

## 2023-12-29 NOTE — Progress Notes (Signed)
 New patient visit   Patient: Shaun Hanson   DOB: [birth year]   58 y.o. Male  MRN: [id number] Visit Date: 12/29/2023  Today's healthcare provider: LAURAINE LOISE BUOY, DO   Chief Complaint  Patient presents with   New Patient (Initial Visit)    Patient is here today for establish care with primary provider, has not had PCP since 2016.  Wanting blood work, wants prostate check, also has bleeding from hemorrhoids.  Also has left foot (heel) pain, wants a prescription for valtrax or an equivalent whichever is cheaper.    Vaccines declined states he does not do vaccines.   Subjective    Shaun Hanson is a 58 y.o. male who presents today as a new patient to establish care.  HPI HPI     New Patient (Initial Visit)    Additional comments: Patient is here today for establish care with primary provider, has not had PCP since 2016.  Wanting blood work, wants prostate check, also has bleeding from hemorrhoids.  Also has left foot (heel) pain, wants a prescription for valtrax or an equivalent whichever is cheaper.    Vaccines declined states he does not do vaccines.      Last edited by Buoy Lauraine LOISE, DO on 12/29/2023  3:45 PM.       Shaun Hanson is a 58 year old male who presents with bleeding hemorrhoids.  He experiences bleeding from hemorrhoids approximately once every six months, which has worsened with the use of doxycycline prescribed for skin issues. The bleeding is significant, filling the toilet, but is not associated with pain. This issue has persisted for a couple of years, with increased frequency recently due to straining from antibiotic use. He has daily bowel movements, and his stools are generally soft unless affected by medication.  He had a colonoscopy between 2014 and 2017, during which a 5mm adenomatous polyp was found. A repeat colonoscopy was recommended in five years, which he has not yet completed.  He is currently taking a generic version of tadalafil  20 mg  for erectile dysfunction, obtained through an online service, and doxycycline for skin issues. He has requested a prescription for valacyclovir  to manage rare herpes outbreaks and inquired about Lasix for swelling in his right leg and hands, which he attributes to weight gain and alcohol consumption.  He experiences pain in his left foot, particularly in the heel, which has been ongoing for months and sometimes wakes him at night. He attributes some of his symptoms to lifestyle factors, including alcohol consumption and weight gain after quitting smoking.  He reports a history of good blood pressure until recent weight gain, with a current concern about elevated blood pressure. He monitors his blood pressure at home and attributes changes to weight gain and lifestyle factors. He consumes a diet high in fiber, with a focus on vegetables and lean meats, and avoids processed foods and soft drinks. He drinks alcohol regularly, consuming about a fifth of bourbon per week, and acknowledges that this may contribute to his symptoms.  No significant family history of prostate cancer. Occasional difficulty with bladder emptying but no significant urinary symptoms. He has a history of skin problems for many years, which have not improved with various treatments, including doxycycline. He experiences excessive saliva around his vocal cords, particularly in the morning, but denies significant reflux symptoms.     Past Medical History:  Diagnosis Date   Complication of anesthesia    WOKE UP DURING  HERNIA SURGERY   Erectile dysfunction    Leg edema, right    Past Surgical History:  Procedure Laterality Date   CERVICAL SPINE SURGERY     COLONOSCOPY WITH PROPOFOL  N/A 10/10/2014   Procedure: COLONOSCOPY WITH PROPOFOL ;  Surgeon: Rogelia Copping, MD;  Location[facility];  Service: Endoscopy;  Laterality: N/A;   HERNIA REPAIR     UMBILICAL SURGERY   POLYPECTOMY N/A 10/10/2014   Procedure: POLYPECTOMY;   Surgeon: Rogelia Copping, MD;  Location: [facility];  Service: Endoscopy;  Laterality: N/A;  transverse colon polyp   Family Status  Relation Name Status   Mother  Deceased   Father  Alive   Sister  Alive   Son  Alive   Son  Alive  No partnership data on file   History reviewed. No pertinent family history. Social History   Socioeconomic History   Marital status: Single    Spouse name: Not on file   Number of children: Not on file   Years of education: Not on file   Highest education level: Not on file  Occupational History   Not on file  Tobacco Use   Smoking status: Former    Current packs/day: 0.50    Average packs/day: 0.5 packs/day for 10.0 years (5.0 ttl pk-yrs)    Types: Cigarettes   Smokeless tobacco: Not on file  Substance and Sexual Activity   Alcohol use: Yes    Alcohol/week: 0.0 standard drinks of alcohol    Comment: past history of heavy alcohol use, patient was on 20oz a night and is now to a drink over weekend.    Drug use: Not on file   Sexual activity: Not on file  Other Topics Concern   Not on file  Social History Narrative   Not on file   Social Drivers of Health   Financial Resource Strain: Not on file  Food Insecurity: Not on file  Transportation Needs: Not on file  Physical Activity: Not on file  Stress: Not on file  Social Connections: Not on file   Outpatient Medications Prior to Visit  Medication Sig Note   doxycycline (ADOXA) 100 MG tablet Take 1 tablet (100 mg total) by mouth 2 (two) times daily. Take 100 mg twice daily for one month    [DISCONTINUED] valACYclovir  (VALTREX ) 500 MG tablet Take 1 tablet (500 mg total) by mouth 2 (two) times daily. As needed for flares    tadalafil  (CIALIS ) 20 MG tablet Take 1 tablet (20 mg total) by mouth daily as needed for erectile dysfunction. 12/29/2023: Gets online from [employer]   [DISCONTINUED] furosemide (LASIX) 20 MG tablet TAKE 1 TABLET BY MOUTH EVERY DAY AS NEEDED FOR LEG SWELLING     [DISCONTINUED] furosemide (LASIX) 20 MG tablet TAKE 1 TABLET BY MOUTH EVERY DAY AS NEEDED FOR LEG SWELLING (Patient not taking: Reported on 12/29/2023)    No facility-administered medications prior to visit.   Allergies  Allergen Reactions   Bee Venom Swelling     There is no immunization history on file for this patient.  Health Maintenance  Topic Date Due   Colonoscopy  10/10/2019   Zoster Vaccines- Shingrix (1 of 2) 03/30/2024 (Originally 02/21/2014)   Influenza Vaccine  05/18/2024 (Originally 09/19/2023)   COVID-19 Vaccine (1 - 2025-26 season) 10/19/2024 (Originally 10/20/2023)   DTaP/Tdap/Td (1 - Tdap) 12/28/2024 (Originally 02/22/1983)   Pneumococcal Vaccine: 50+ Years (1 of 1 - PCV) 12/28/2024 (Originally 02/21/2014)   Hepatitis B Vaccines 19-59 Average Risk (1  of 3 - 19+ 3-dose series) 12/28/2024 (Originally 02/22/1983)   Hepatitis C Screening  12/28/2024 (Originally 02/21/1982)   HIV Screening  12/28/2024 (Originally 02/22/1979)   HPV VACCINES  Aged Out   Meningococcal B Vaccine  Aged Out    Patient Care Team: Kenadie Royce, Lauraine SAILOR, DO as PCP - General (Family Medicine)  Review of Systems  Constitutional:  Negative for appetite change, chills, fatigue and fever.  HENT:  Negative for congestion, ear pain, hearing loss, nosebleeds and trouble swallowing.   Eyes:  Negative for pain and visual disturbance.  Respiratory:  Negative for cough, chest tightness and shortness of breath.   Cardiovascular:  Negative for chest pain, palpitations and leg swelling.  Gastrointestinal:  Positive for blood in stool (hemorrhoid). Negative for abdominal pain, constipation, diarrhea, nausea and vomiting.  Endocrine: Negative for polydipsia, polyphagia and polyuria.  Genitourinary:  Negative for dysuria and flank pain.  Musculoskeletal:  Negative for arthralgias, back pain, joint swelling, myalgias and neck stiffness.  Skin:  Negative for color change, rash and wound.  Neurological:  Negative for dizziness,  tremors, seizures, speech difficulty, weakness, light-headedness and headaches.  Psychiatric/Behavioral:  Negative for behavioral problems, confusion, decreased concentration, dysphoric mood and sleep disturbance. The patient is not nervous/anxious.   All other systems reviewed and are negative.       Objective    BP 131/80 (BP Location: Left Arm, Patient Position: Sitting, Cuff Size: Large)   Pulse 70   Temp 98 F (36.7 C) (Oral)   Ht 6' (1.829 m)   Wt 265 lb 12.8 oz (120.6 kg)   SpO2 98%   BMI 36.05 kg/m     Physical Exam Vitals and nursing note reviewed.  Constitutional:      General: He is not in acute distress.    Appearance: Normal appearance.  HENT:     Head: Normocephalic and atraumatic.  Eyes:     General: No scleral icterus.    Conjunctiva/sclera: Conjunctivae normal.  Cardiovascular:     Rate and Rhythm: Normal rate.  Pulmonary:     Effort: Pulmonary effort is normal.  Musculoskeletal:     Left foot: Deformity (Haglund Deformity noted to medial left heel) present.  Neurological:     Mental Status: He is alert and oriented to person, place, and time. Mental status is at baseline.  Psychiatric:        Mood and Affect: Mood normal.        Behavior: Behavior normal.     Results for orders placed or performed in visit on 12/29/23  Comprehensive metabolic panel with GFR  Result Value Ref Range   Glucose 101 (H) 70 - 99 mg/dL   BUN 24 6 - 24 mg/dL   Creatinine, Ser 9.25 (L) 0.76 - 1.27 mg/dL   eGFR 895 >40 fO/fpw/8.26   BUN/Creatinine Ratio 32 (H) 9 - 20   Sodium 139 134 - 144 mmol/L   Potassium 4.3 3.5 - 5.2 mmol/L   Chloride 102 96 - 106 mmol/L   CO2 23 20 - 29 mmol/L   Calcium 9.4 8.7 - 10.2 mg/dL   Total Protein 7.1 6.0 - 8.5 g/dL   Albumin 4.6 3.8 - 4.9 g/dL   Globulin, Total 2.5 1.5 - 4.5 g/dL   Bilirubin Total 0.8 0.0 - 1.2 mg/dL   Alkaline Phosphatase 70 47 - 123 IU/L   AST 22 0 - 40 IU/L   ALT 26 0 - 44 IU/L  Hemoglobin A1c  Result Value  Ref Range  Hgb A1c MFr Bld 5.6 4.8 - 5.6 %   Est. average glucose Bld gHb Est-mCnc 114 mg/dL  Lipid panel  Result Value Ref Range   Cholesterol, Total 222 (H) 100 - 199 mg/dL   Triglycerides 825 (H) 0 - 149 mg/dL   HDL 43 >60 mg/dL   VLDL Cholesterol Cal 32 5 - 40 mg/dL   LDL Chol Calc (NIH) 852 (H) 0 - 99 mg/dL   Chol/HDL Ratio 5.2 (H) 0.0 - 5.0 ratio  PSA Total (Reflex To Free)  Result Value Ref Range   Prostate Specific Ag, Serum 0.3 0.0 - 4.0 ng/mL   Reflex Criteria Comment     Assessment & Plan     Haglund deformity of left heel -     Ambulatory referral to Podiatry  Establishing care with new doctor, encounter for  Internal hemorrhoid, bleeding  History of adenomatous polyp -     Ambulatory referral to Gastroenterology  Recurrent genital HSV (herpes simplex virus) infection -     valACYclovir  HCl; Take 1 tablet (500 mg total) by mouth 2 (two) times daily. As needed for flares  Dispense: 30 tablet; Refill: 2  Mixed hyperlipidemia -     Lipid panel  Elevated ALT measurement -     Comprehensive metabolic panel with GFR  Bilateral lower extremity edema -     Furosemide; Take 1 tablet (20 mg total) by mouth daily as needed.  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 - Referred to podiatry for further evaluation and management. - Provided exercises for  stretching and strengthening the heel. - Consider over-the-counter NSAIDs for pain management.  Morbid obesity due to excess calories Morbid obesity with recent weight gain post-smoking cessation and increased alcohol intake. Acknowledged alcohol's role in weight gain and swelling. - Encouraged reduction of alcohol intake to a maximum of two drinks per day. - Discussed lifestyle modifications for weight management.  Internal hemorrhoids, bleeding Intermittent bleeding likely from internal hemorrhoids, increased by doxycycline and straining. Last colonoscopy in 2016 showed a 5mm adenomatous polyp. - Referred to gastroenterology for evaluation and possible colonoscopy.  Recurrent genital HSV (herpes simplex virus) infection Rare herpesviral outbreaks. Requested valacyclovir  for management. - Prescribed valacyclovir  for as-needed outbreak management.  Localized edema of right lower extremity and hands Persistent swelling in right leg and hands, worsened by weight gain and alcohol. Improves with weight loss and reduced alcohol. - Encouraged weight loss and reduction of alcohol intake to manage edema.  Mixed hyperlipidemia Elevated cholesterol noted a decade ago. Reports healthy diet. - Ordered cholesterol panel to assess current lipid levels.  Elevated ALT measurement; alcohol abuse, episodic Elevated liver transaminases, possibly related to alcohol. - Ordered liver function tests to monitor liver health. - Counseled patient on reduction of alcohol and limiting daily intake to a maximum of 2 standard drinks.  Bilateral lower extremity edema  Chronic, reported to be stable by patient.   - Will prescribe short course of furosemide. - Encouraged weight loss and reduction of alcohol intake as well  Severe obesity (BMI 35.0-39.9) with comorbidity - Counseled patient on lifestyle modifications  History of adenomatous polyp  Adenomatous polyp found in 2016, due for follow-up colonoscopy in  2021, now overdue. - Referred to gastroenterology for follow-up colonoscopy.  General Health Maintenance Routine health maintenance discussed. Declined HIV and hepatitis C screening. No family history of prostate cancer. - Ordered metabolic panel, cholesterol panel, A1c, and PSA test. - Encouraged regular blood pressure monitoring at home. - Discussed lifestyle modifications for overall health improvement.    Return in about 1 year (around 12/29/2024) for CPE, Chronic f/u.     I discussed the assessment and treatment plan with the patient  The patient was provided an opportunity to ask questions and all were answered. The patient agreed with the plan and demonstrated an understanding of the instructions.   The patient was advised to call back or seek an in-person evaluation if the symptoms worsen or if the condition fails to improve as anticipated.    LAURAINE LOISE BUOY, DO  [facility] [phone] (phone) [phone] (fax)  [facility]

## 2023-12-30 LAB — COMPREHENSIVE METABOLIC PANEL WITH GFR
ALT: 26 IU/L (ref 0–44)
AST: 22 IU/L (ref 0–40)
Albumin: 4.6 g/dL (ref 3.8–4.9)
Alkaline Phosphatase: 70 IU/L (ref 47–123)
BUN/Creatinine Ratio: 32 — ABNORMAL HIGH (ref 9–20)
BUN: 24 mg/dL (ref 6–24)
Bilirubin Total: 0.8 mg/dL (ref 0.0–1.2)
CO2: 23 mmol/L (ref 20–29)
Calcium: 9.4 mg/dL (ref 8.7–10.2)
Chloride: 102 mmol/L (ref 96–106)
Creatinine, Ser: 0.74 mg/dL — ABNORMAL LOW (ref 0.76–1.27)
Globulin, Total: 2.5 g/dL (ref 1.5–4.5)
Glucose: 101 mg/dL — ABNORMAL HIGH (ref 70–99)
Potassium: 4.3 mmol/L (ref 3.5–5.2)
Sodium: 139 mmol/L (ref 134–144)
Total Protein: 7.1 g/dL (ref 6.0–8.5)
eGFR: 104 mL/min/1.73 (ref >59)

## 2023-12-30 LAB — PSA TOTAL (REFLEX TO FREE): Prostate Specific Ag, Serum: 0.3 ng/mL (ref 0.0–4.0)

## 2023-12-30 LAB — HEMOGLOBIN A1C
Est. average glucose Bld gHb Est-mCnc: 114 mg/dL
Hgb A1c MFr Bld: 5.6 % (ref 4.8–5.6)

## 2023-12-30 LAB — LIPID PANEL
Chol/HDL Ratio: 5.2 ratio — ABNORMAL HIGH (ref 0.0–5.0)
Cholesterol, Total: 222 mg/dL — ABNORMAL HIGH (ref 100–199)
HDL: 43 mg/dL (ref >39)
LDL Chol Calc (NIH): 147 mg/dL — ABNORMAL HIGH (ref 0–99)
Triglycerides: 174 mg/dL — ABNORMAL HIGH (ref 0–149)
VLDL Cholesterol Cal: 32 mg/dL (ref 5–40)

## 2024-01-01 ENCOUNTER — Ambulatory Visit: Admitting: Dermatology

## 2024-01-01 ENCOUNTER — Encounter: Payer: Self-pay | Admitting: Dermatology

## 2024-01-01 DIAGNOSIS — S0001XA Abrasion of scalp, initial encounter: Secondary | ICD-10-CM

## 2024-01-01 DIAGNOSIS — L858 Other specified epidermal thickening: Secondary | ICD-10-CM | POA: Diagnosis not present

## 2024-01-01 DIAGNOSIS — D492 Neoplasm of unspecified behavior of bone, soft tissue, and skin: Secondary | ICD-10-CM

## 2024-01-01 NOTE — Patient Instructions (Addendum)
 Wash your hands. Clean the wound gently with shampoo and water, and then pat dry. Do not rub. Apply a small amount of Petroleum Jelly or Vaseline. A small amount of bleeding is normal. If bleeding persists, apply firm pressure for 5 to 10 minutes without interruption. If bleeding continues, call our office. Continue to clean the area as directed above until the wound is healed. Shave biopsies may take several weeks to heal. It is normal if the edges are pink/red and the center is slight yellowish or white in color. However if the site becomes hot, swollen, has a thick drainage or redness that expands away from the site please call us . We will contact you with results once available.    Due to recent changes in healthcare [place], you may see results of your pathology and/or laboratory studies on MyChart before the doctors have had a chance to review them. We understand that in some cases there may be results that are confusing or concerning to you. Please understand that not all results are received at the same time and often the doctors may need to interpret multiple results in order to provide you with the best plan of care or course of treatment. Therefore, we ask that you please give us  2 business days to thoroughly review all your results before contacting the office for clarification. Should we see a critical lab result, you will be contacted sooner.   If You Need Anything After Your Visit  If you have any questions or concerns for your doctor, please call our main line at [phone] and press option 4 to reach your doctor's medical assistant. If no one answers, please leave a voicemail as directed and we will return your call as soon as possible. Messages left after 4 pm will be answered the following business day.   You may also send us  a message via MyChart. We typically respond to MyChart messages within 1-2 business days.  For prescription refills, please ask your pharmacy to contact our  office. Our fax number is [phone].  If you have an urgent issue when the clinic is closed that cannot wait until the next business day, you can page your doctor at the number below.    Please note that while we do our best to be available for urgent issues outside of office hours, we are not available 24/7.   If you have an urgent issue and are unable to reach us , you may choose to seek medical care at your doctor's office, retail clinic, urgent care center, or emergency room.  If you have a medical emergency, please immediately call 911 or go to the emergency department.  Pager Numbers  - Dr. Hester: [phone]  - Dr. Jackquline: [phone]  - Dr. Claudene: [phone]   - Dr. Raymund: [phone]  In the event of inclement weather, please call our main line at [phone] for an update on the status of any delays or closures.  Dermatology Medication Tips: Please keep the boxes that topical medications come in in order to help keep track of the instructions about where and how to use these. Pharmacies typically print the medication instructions only on the boxes and not directly on the medication tubes.   If your medication is too expensive, please contact our office at [phone] option 4 or send us  a message through MyChart.   We are unable to tell what your co-pay for medications will be in advance as this is different depending on your insurance coverage. However,  we may be able to find a substitute medication at lower cost or fill out paperwork to get insurance to cover a needed medication.   If a prior authorization is required to get your medication covered by your insurance company, please allow us  1-2 business days to complete this process.  Drug prices often vary depending on where the prescription is filled and some pharmacies may offer cheaper prices.  The website www.goodrx.com contains coupons for medications through different pharmacies. The prices here do not  account for what the cost may be with help from insurance (it may be cheaper with your insurance), but the website can give you the price if you did not use any insurance.  - You can print the associated coupon and take it with your prescription to the pharmacy.  - You may also stop by our office during regular business hours and pick up a GoodRx coupon card.  - If you need your prescription sent electronically to a different pharmacy, notify our office through [facility] or by phone at [phone] option 4.     Si Usted Necesita Algo Despus de Su Visita  Tambin puede enviarnos un mensaje a travs de Clinical Cytogeneticist. Por lo general respondemos a los mensajes de MyChart en el transcurso de 1 a 2 das hbiles.  Para renovar recetas, por favor pida a su farmacia que se ponga en contacto con nuestra oficina. Randi lakes de fax es [place] [phone].  Si tiene un asunto urgente cuando la clnica est cerrada y que no puede esperar hasta el siguiente da hbil, puede llamar/localizar a su doctor(a) al nmero que aparece a continuacin.   Por favor, tenga en cuenta que aunque hacemos todo lo posible para estar disponibles para asuntos urgentes fuera del horario de [place], no estamos disponibles las 24 horas del da, los 7 [address]  [phone] de la [place].   Si tiene un problema urgente y no puede comunicarse con nosotros, puede optar por buscar atencin mdica  en el consultorio de su doctor(a), en una clnica privada, en un centro de atencin urgente o en una sala de emergencias.  Si tiene engineer, drilling, por favor llame inmediatamente al 911 o vaya a la sala de emergencias.  Nmeros de bper  - Dr. Hester: [phone]  - Dra. Jackquline: [phone]  - Dr. Claudene: [phone]  - Dra. Kitts: [phone]  En caso de inclemencias del [place], por favor llame a nuestra lnea principal al [phone] para una actualizacin sobre el estado de cualquier retraso o cierre.  Consejos para la  medicacin en dermatologa: Por favor, guarde las cajas en las que vienen los medicamentos de uso tpico para ayudarle a seguir las instrucciones sobre dnde y cmo usarlos. Las farmacias generalmente imprimen las instrucciones del medicamento slo en las cajas y no directamente en los tubos del [place].   Si su medicamento es muy caro, por favor, pngase en contacto con landry rieger llamando al [phone] y presione la opcin 4 o envenos un mensaje a travs de Clinical Cytogeneticist.   No podemos decirle cul ser su copago por los medicamentos por adelantado ya que esto es diferente dependiendo de la cobertura de su seguro. Sin embargo, es posible que podamos encontrar un medicamento sustituto a audiological scientist un formulario para que el seguro cubra el medicamento que se considera necesario.   Si se requiere una autorizacin previa para que su compaa de seguros cubra su medicamento, por favor permtanos de 1 a 2 das hbiles para completar este proceso.  Los precios de los medicamentos varan con frecuencia dependiendo del environmental consultant de dnde se surte la receta y alguna farmacias pueden ofrecer precios ms baratos.  El sitio web www.goodrx.com tiene cupones para medicamentos de health and safety inspector. Los precios aqu no tienen en cuenta lo que podra costar con la ayuda del seguro (puede ser ms barato con su seguro), pero el sitio web puede darle el precio si no utiliz tourist information centre manager.  - Puede imprimir el cupn correspondiente y llevarlo con su receta a la farmacia.  - Tambin puede pasar por nuestra oficina durante el horario de atencin regular y education officer, museum una tarjeta de cupones de GoodRx.  - Si necesita que su receta se enve electrnicamente a una farmacia diferente, informe a nuestra oficina a travs de MyChart de [place] o por telfono llamando al [phone] y presione la opcin 4.

## 2024-01-01 NOTE — Progress Notes (Signed)
   Follow-Up Visit   Subjective  Shaun Hanson is a 58 y.o. male who presents for the following: 1 month excoriations/folliculitis follow up. Scalp, face, upper limbs. Taking Doxycycline twice daily. States condition is still the same.   The following portions of the chart were reviewed this encounter and updated as appropriate: medications, allergies, medical history  Review of Systems:  No other skin or systemic complaints except as noted in HPI or Assessment and Plan.  Objective  Well appearing patient in no apparent distress; mood and affect are within normal limits.   A focused examination was performed of the following areas: Scalp, face, upper limbs.   Relevant exam findings are noted in the Assessment and Plan.  Left Occipital Scalp 5 mm crusted pink papule   Assessment & Plan     NEOPLASM OF SKIN Left Occipital Scalp Skin / nail biopsy Type of biopsy: tangential   Informed consent: discussed and consent obtained   Timeout: patient name, date of birth, surgical site, and procedure verified   Procedure prep:  Patient was prepped and draped in usual sterile fashion Prep type:  Isopropyl alcohol Anesthesia: the lesion was anesthetized in a standard fashion   Anesthetic:  1% lidocaine  w/ epinephrine 1-100,000 buffered w/ 8.4% NaHCO3 Instrument used: DermaBlade   Hemostasis achieved with: pressure and aluminum chloride   Outcome: patient tolerated procedure well   Post-procedure details: sterile dressing applied and wound care instructions given   Dressing type: bandage and petrolatum    Specimen 1 - Surgical pathology Differential Diagnosis: excoriation vs folliculitis vs impetigo vs prurigo nodule  Check Margins: No  Return for Follow up pending pathology results.  I, Amari Zagal, CMA, am acting as scribe for Boneta Sharps, MD.   Documentation: I have reviewed the above documentation for accuracy and completeness, and I agree with the  above.  Boneta Sharps, MD

## 2024-01-04 ENCOUNTER — Encounter: Payer: Self-pay | Admitting: Dermatology

## 2024-01-05 ENCOUNTER — Ambulatory Visit: Payer: Self-pay | Admitting: Family Medicine

## 2024-01-05 LAB — SURGICAL PATHOLOGY

## 2024-01-06 NOTE — Progress Notes (Signed)
 Advised patient of provider's message, he stated that he believes it has to do with him drinking too much alcohol and that he would rather slow down and or stop drinking to try see if that has anything to do with it.  States he would come back in 6 months to recheck his levels because he would rather take control of it rather than being put on medications for cholesterol.

## 2024-01-07 ENCOUNTER — Ambulatory Visit

## 2024-01-07 ENCOUNTER — Ambulatory Visit: Payer: Self-pay | Admitting: Dermatology

## 2024-01-07 ENCOUNTER — Encounter: Payer: Self-pay | Admitting: Dermatology

## 2024-01-08 ENCOUNTER — Telehealth: Payer: Self-pay

## 2024-01-08 ENCOUNTER — Other Ambulatory Visit: Payer: Self-pay

## 2024-01-08 DIAGNOSIS — Z860100 Personal history of colon polyps, unspecified: Secondary | ICD-10-CM

## 2024-01-08 MED ORDER — NA SULFATE-K SULFATE-MG SULF 17.5-3.13-1.6 GM/177ML PO SOLN: 1.0000 | Freq: Once | ORAL | 0 refills | Status: AC

## 2024-01-08 NOTE — Addendum Note (Signed)
 Addended by: CURTISS ROSALINE RAMAN on: 01/08/2024 05:08 PM   Modules accepted: Orders

## 2024-01-08 NOTE — Telephone Encounter (Signed)
 MyChart message sent to patient.

## 2024-01-08 NOTE — Telephone Encounter (Signed)
 Gastroenterology Pre-Procedure Review  Request Date: Office visit scheduled 02/25/24 Requesting Physician: Grayce Bohr, NP  PATIENT REVIEW QUESTIONS: The patient responded to the following health history questions as indicated:    1. Are you having any GI issues? yes (bleeding hemorrhoids) 2. Do you have a personal history of Polyps? yes (last colonoscopy performed by Dr. Jinny  10/10/14 recommended repeat in 5 years) 3. Do you have a family history of Colon Cancer or Polyps? no 4. Diabetes Mellitus? no 5. Joint replacements in the past 12 months?no 6. Major health problems in the past 3 months?no 7. Any artificial heart valves, MVP, or defibrillator?no    MEDICATIONS & ALLERGIES:    Patient reports the following regarding taking any anticoagulation/antiplatelet therapy:   Plavix, Coumadin, Eliquis, Xarelto, Lovenox, Pradaxa, Brilinta, or Effient? no Aspirin? no  Patient confirms/reports the following medications:  Current Outpatient Medications  Medication Sig Dispense Refill   doxycycline  (ADOXA) 100 MG tablet Take 1 tablet (100 mg total) by mouth 2 (two) times daily. Take 100 mg twice daily for one month 60 tablet 0   furosemide  (LASIX ) 20 MG tablet Take 1 tablet (20 mg total) by mouth daily as needed. 30 tablet 0   tadalafil  (CIALIS ) 20 MG tablet Take 1 tablet (20 mg total) by mouth daily as needed for erectile dysfunction. 6 tablet 11   valACYclovir  (VALTREX ) 500 MG tablet Take 1 tablet (500 mg total) by mouth 2 (two) times daily. As needed for flares 30 tablet 2   No current facility-administered medications for this visit.    Patient confirms/reports the following allergies:  Allergies  Allergen Reactions   Bee Venom Swelling    No orders of the defined types were placed in this encounter.   AUTHORIZATION INFORMATION Primary Insurance: 1D#: Group #:  Secondary Insurance: 1D#: Group #:  SCHEDULE INFORMATION: Date: Office visit scheduled 02/25/24 with Grayce Bohr, NP Time:8:30am Location:

## 2024-01-08 NOTE — Telephone Encounter (Signed)
-----   Message from [place] sent at 01/07/2024  9:09 PM EST ----- Diagnosis: left occipital scalp :       EPIDERMAL HYPERPLASIA WITH EXCORIATION, SEE DESCRIPTION   Plan: none, sent mychart message ----- Message ----- From: Interface, Lab In [place] Sent: 01/05/2024   4:45 PM EST To: Boneta Sharps, MD

## 2024-01-13 ENCOUNTER — Encounter: Payer: Self-pay | Admitting: Podiatry

## 2024-01-13 ENCOUNTER — Ambulatory Visit

## 2024-01-13 ENCOUNTER — Ambulatory Visit: Admitting: Podiatry

## 2024-01-13 VITALS — Ht 72.0 in | Wt 265.8 lb

## 2024-01-13 DIAGNOSIS — M7662 Achilles tendinitis, left leg: Secondary | ICD-10-CM | POA: Diagnosis not present

## 2024-01-13 DIAGNOSIS — M9262 Juvenile osteochondrosis of tarsus, left ankle: Secondary | ICD-10-CM

## 2024-01-13 MED ORDER — MELOXICAM 15 MG PO TABS: 15.0000 mg | ORAL_TABLET | Freq: Every day | ORAL | 1 refills | Status: DC

## 2024-01-13 MED ORDER — METHYLPREDNISOLONE 4 MG PO TBPK: ORAL_TABLET | ORAL | 0 refills | Status: DC

## 2024-01-25 DIAGNOSIS — M7662 Achilles tendinitis, left leg: Secondary | ICD-10-CM | POA: Diagnosis not present

## 2024-01-25 MED ORDER — BETAMETHASONE SOD PHOS & ACET 6 (3-3) MG/ML IJ SUSP
3.0000 mg | Freq: Once | INTRAMUSCULAR | Status: AC
  Administered 2024-01-25: 3 mg via INTRA_ARTICULAR

## 2024-01-25 NOTE — Progress Notes (Signed)
   Chief Complaint  Patient presents with   Foot Pain    Pt is here due to left heel pain, he states the pain has been going on and off for years, states the last few months the pain has been more constant, states he hurts all the time.    HPI: 57 y.o. male presenting today as a new patient for evaluation of left posterior heel pain  Past Medical History:  Diagnosis Date   Complication of anesthesia    WOKE UP DURING HERNIA SURGERY   Erectile dysfunction    Leg edema, right     Past Surgical History:  Procedure Laterality Date   CERVICAL SPINE SURGERY     COLONOSCOPY WITH PROPOFOL  N/A 10/10/2014   Procedure: COLONOSCOPY WITH PROPOFOL ;  Surgeon: Rogelia Copping, MD;  Location: [facility];  Service: Endoscopy;  Laterality: N/A;   HERNIA REPAIR     UMBILICAL SURGERY   POLYPECTOMY N/A 10/10/2014   Procedure: POLYPECTOMY;  Surgeon: Rogelia Copping, MD;  Location: [facility];  Service: Endoscopy;  Laterality: N/A;  transverse colon polyp    Allergies  Allergen Reactions   Bee Venom Swelling     Physical Exam: General: The patient is alert and oriented x3 in no acute distress.  Dermatology: Skin is warm, dry and supple bilateral lower extremities.   Vascular: Palpable pedal pulses bilaterally. Capillary refill within normal limits.  No appreciable edema.  No erythema.  Neurological: Grossly intact via light touch  Musculoskeletal Exam: Prominent posterior tubercle of the calcaneus with associated tenderness.  Muscle strength 5/5 all compartments.  Range of motion WNL  Radiographic Exam LT foot 01/13/2024:  Normal osseous mineralization. Joint spaces preserved.  Large posterior plantar heel spur noted on lateral view with embedded ossicle at the insertion of the Achilles tendon onto the posterior tubercle of the calcaneus just superior to the posterior heel spur noted  Assessment/Plan of Care: 1.  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  X-rays reviewed -Today we discussed the pathology and etiology of the posterior heel spur which is symptomatic and painful chronically.  We discussed treatment options both conservative and surgical.  For now we will continue to pursue conservative treatment management -Injection of 0.5 cc Celestone  Soluspan injected around the posterior tubercle of the calcaneus.  Care taken to avoid injection into the Achilles -Prescription for Medrol  Dosepak -Prescription for meloxicam  15 mg daily after completion of the Dosepak -Recommend daily stretching exercises -Recommend shoes that do not irritate or constrict the posterior aspect of the heel -Return to clinic PRN      Thresa EMERSON Sar, DPM [facility]  Dr. Thresa EMERSON Sar, DPM    [address] [place], [place] [address]                Office [phone]  Fax [phone]

## 2024-01-26 ENCOUNTER — Ambulatory Visit: Admitting: Dermatology

## 2024-01-26 ENCOUNTER — Encounter: Payer: Self-pay | Admitting: Dermatology

## 2024-01-26 DIAGNOSIS — L739 Follicular disorder, unspecified: Secondary | ICD-10-CM

## 2024-01-26 DIAGNOSIS — D485 Neoplasm of uncertain behavior of skin: Secondary | ICD-10-CM

## 2024-01-26 DIAGNOSIS — Z8619 Personal history of other infectious and parasitic diseases: Secondary | ICD-10-CM

## 2024-01-26 NOTE — Patient Instructions (Addendum)

## 2024-01-26 NOTE — Progress Notes (Signed)
   Follow-Up Visit   Subjective  Shaun Hanson is a 58 y.o. male who presents for the following: bx proven hyperplasia follow up at scalp. Patient doesn't think they're as severe as they have been but they seem to be lasting for a long time and sometimes itchy at face, scalp, arms, buttocks and back. He does use alcohol and hydrogen peroxide to affected areas. Patient did take doxycycline  for a month but had constipation with it. Patient was diagnosed with Lyme's disease in mid-nineties. Patient said that inflammation from Lyme's has been intense and wonders if it could be related. Patient has been prescribed meloxicam  by podiatrist which seems to help with inflammation/bone spur at foot. Also was given a rx for prednisone which he hasn't started yet.   The following portions of the chart were reviewed this encounter and updated as appropriate: medications, allergies, medical history  Review of Systems:  No other skin or systemic complaints except as noted in HPI or Assessment and Plan.  Objective  Well appearing patient in no apparent distress; mood and affect are within normal limits.   A focused examination was performed of the following areas: Scalp, neck, face, back  Relevant exam findings are noted in the Assessment and Plan.    Assessment & Plan   FOLLICULITIS Exam: Perifollicular erythematous papules on back  Folliculitis occurs due to inflammation of the superficial hair follicle (pore), resulting in acne-like lesions (pus bumps). It can be infectious (bacterial, fungal) or noninfectious (shaving, tight clothing, heat/sweat, medications).  Folliculitis can be acute or chronic and recommended treatment depends on the underlying cause of folliculitis.  Treatment Plan: Recommend Hibiclens, patient advises he has used in the past but it doesn't help at all.   Poorly healing, recurrent bumps that appear on different areas of body, biopsy of one on scalp revealed excoriation Failed  hydrogen peroxide, alcohol, doxycycline  x 1 month  Plan: Discussed option of longer course of doxycycline , may suppress bumps but will likely recur after stopping, risk of GI upset and altering microbiome if used long-term. Patient defers given constipation side effect Recommend Aquaphor and/or medihoney daily for wound care Recommend Vashe wash daily to reduce bumps and folliculitis Given hx of lyme disease, offered referral to infectious diseases to evaluate for any possible long-term manifestations   HISTORY OF LYME DISEASE   Related Procedures Ambulatory referral to Infectious Disease NEOPLASM OF UNCERTAIN BEHAVIOR OF SKIN   FOLLICULITIS   Related Medications doxycycline  (ADOXA) 100 MG tablet Take 1 tablet (100 mg total) by mouth 2 (two) times daily. Take 100 mg twice daily for one month  Return if symptoms worsen or fail to improve.  LILLETTE Lonell Drones, RMA, am acting as scribe for Boneta Sharps, MD .   Documentation: I have reviewed the above documentation for accuracy and completeness, and I agree with the above.  Boneta Sharps, MD

## 2024-02-20 ENCOUNTER — Telehealth: Payer: Self-pay

## 2024-02-20 DIAGNOSIS — R635 Abnormal weight gain: Secondary | ICD-10-CM | POA: Insufficient documentation

## 2024-02-20 DIAGNOSIS — N342 Other urethritis: Secondary | ICD-10-CM | POA: Insufficient documentation

## 2024-02-20 DIAGNOSIS — F17200 Nicotine dependence, unspecified, uncomplicated: Secondary | ICD-10-CM | POA: Insufficient documentation

## 2024-02-20 DIAGNOSIS — Z760 Encounter for issue of repeat prescription: Secondary | ICD-10-CM | POA: Insufficient documentation

## 2024-02-20 DIAGNOSIS — G4733 Obstructive sleep apnea (adult) (pediatric): Secondary | ICD-10-CM | POA: Insufficient documentation

## 2024-02-20 DIAGNOSIS — R490 Dysphonia: Secondary | ICD-10-CM | POA: Insufficient documentation

## 2024-02-20 DIAGNOSIS — J069 Acute upper respiratory infection, unspecified: Secondary | ICD-10-CM | POA: Insufficient documentation

## 2024-02-20 DIAGNOSIS — L309 Dermatitis, unspecified: Secondary | ICD-10-CM | POA: Insufficient documentation

## 2024-02-20 DIAGNOSIS — H00019 Hordeolum externum unspecified eye, unspecified eyelid: Secondary | ICD-10-CM | POA: Insufficient documentation

## 2024-02-20 DIAGNOSIS — R600 Localized edema: Secondary | ICD-10-CM | POA: Insufficient documentation

## 2024-02-20 DIAGNOSIS — N529 Male erectile dysfunction, unspecified: Secondary | ICD-10-CM | POA: Insufficient documentation

## 2024-02-20 NOTE — Telephone Encounter (Signed)
 Per pt insurance [employer]  [phone] pt needs to have procedure after 10-10-2014.  Pt has appt on 02-25-2024 with NP. I will wait until 02-25-2024 get the NP note and resubmit.

## 2024-02-23 NOTE — Telephone Encounter (Signed)
 I just wanted to give you heads up about pt.

## 2024-02-24 NOTE — Progress Notes (Unsigned)
 "   02/25/2024 Shaun Hanson [id number] 03-15-1965  Gastroenterology Office Note    Referring Provider: Donzella Lauraine SAILOR, DO Primary Care Physician:  Donzella Lauraine SAILOR, DO  Primary GI Provider: Jinny Carmine, MD    Chief Complaint   Chief Complaint  Patient presents with   New Patient (Initial Visit)    Bleeding hemorrhoids- was taking antibiotics-caused constipation and caused the bleeding to start- normal BM's -not currently taking any fiber-watches his diet      History of Present Illness   Shaun Hanson is a 59 y.o. male presenting today at the request of Pardue, Sarah N, DO due to history of colon polyps and bleeding hemorrhoids.  Discussed the use of AI scribe software for clinical note transcription with the patient, who gave verbal consent to proceed.  Intermittent rectal bleeding has occurred for many years, with episodes every one to six months. Each episode involves a large amount of blood in the toilet bowl, typically resolving within 24 to 48 hours without immediate recurrence. The most recent episode was in Oct 2025 following a course of antibiotics and was associated with a hard stool and straining. Bleeding is most often linked to harder stools and straining, with rare observation of blood mixed in the stool. Most stools are semi-formed.  No current rectal pain or itching. Occasional spongy sensation when wiping and infrequent trace blood on toilet paper, believed to be external. No ongoing pain or irritation.   Bowel frequency has increased from once to twice daily over the past two months. The patient reports reduced alcohol intake during this time. Diet is high in vegetables, chicken, and fiber, with avoidance of processed foods, sugar, and salt. Regular use of Metamucil. Fluid intake includes a bottle of water daily, unsweetened tea, two cups of coffee, and orange juice in the morning.  Constipation occurs when traveling, with intervals of two to four days without a  bowel movement, managed with Metamucil. No prior use of Miralax. Stool shape is often flat and sometimes large, but not sausage-shaped. No abdominal pain. Last colonoscopy was in 2016; repeat colonoscopy due in 2021. Colonoscopy already scheduled for February 2026.   Seen by PCP on 12/29/2023 with bleeding internal hemorrhoids and overdue for surveillance colonoscopy.   10/10/2014 Colonoscopy - One 5 mm polyp in the transverse colon. Resected and retrieved.  - The examination was otherwise normal. - Repeat in 5 year for surveillance.    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   Edema leg 02/20/2024   Erectile dysfunction    Hordeolum externum 02/20/2024   Infection of the upper respiratory tract 02/20/2024   Inflammation of urethra 02/20/2024   Leg edema, right    Severe obstructive sleep apnea 02/20/2024    Past Surgical History:  Procedure Laterality Date   CERVICAL SPINE SURGERY     COLONOSCOPY WITH PROPOFOL  N/A 10/10/2014   Procedure: COLONOSCOPY WITH PROPOFOL ;  Surgeon: Carmine Jinny, MD;  Location: [facility];  Service: Endoscopy;  Laterality: N/A;   HERNIA REPAIR     UMBILICAL SURGERY   POLYPECTOMY N/A 10/10/2014   Procedure: POLYPECTOMY;  Surgeon: Carmine Jinny, MD;  Location: [facility];  Service: Endoscopy;  Laterality: N/A;  transverse colon polyp  Current Outpatient Medications  Medication Sig Dispense Refill   furosemide  (LASIX ) 20 MG tablet Take 1 tablet (20 mg total) by mouth daily as needed. 30 tablet 0   tadalafil  (CIALIS ) 20 MG tablet Take 1 tablet (20 mg total) by mouth daily as needed for erectile dysfunction. 6 tablet 11   No current facility-administered medications for this visit.     Allergies as of 02/25/2024 - Review Complete 02/25/2024  Allergen Reaction Noted   Bee venom Swelling 12/29/2023    History reviewed. No pertinent family history.  Social History   Socioeconomic History   Marital status: Single    Spouse name: Not on file   Number of children: Not on file   Years of education: Not on file   Highest education level: Not on file  Occupational History   Not on file  Tobacco Use   Smoking status: Former    Current packs/day: 0.50    Average packs/day: 0.5 packs/day for 10.0 years (5.0 ttl pk-yrs)    Types: Cigarettes   Smokeless tobacco: Not on file  Substance and Sexual Activity   Alcohol use: Yes    Alcohol/week: 0.0 standard drinks of alcohol    Comment: past history of heavy alcohol use, patient was on 20oz a night and is now to a drink over weekend.    Drug use: Not on file   Sexual activity: Not on file  Other Topics Concern   Not on file  Social History Narrative   Not on file   Social Drivers of Health   Tobacco Use: Medium Risk (02/25/2024)   Patient History    Smoking Tobacco Use: Former    Smokeless Tobacco Use: Unknown    Passive Exposure: Not on Actuary Strain: Not on file  Food Insecurity: Not on file  Transportation Needs: Not on file  Physical Activity: Not on file  Stress: Not on file  Social Connections: Not on file  Intimate Partner Violence: Not on file  Depression (EYV7-0): Not on file  Alcohol Screen: Medium Risk (12/29/2023)   Alcohol Screen    Last Alcohol Screening Score (AUDIT): 13  Housing: Not on file  Utilities: Not on file  Health Literacy: Not on file     RELEVANT GI HISTORY, IMAGING AND LABS: CBC No results found for: WBC, RBC, HGB, HCT, PLT, MCV, MCH, MCHC, RDW, LYMPHSABS, MONOABS, EOSABS, BASOSABS No results for input(s): HGB in the last 8760 hours.  CMP     Component Value Date/Time   NA 139 12/29/2023 1619   K 4.3 12/29/2023 1619   CL  102 12/29/2023 1619   CO2 23 12/29/2023 1619   GLUCOSE 101 (H) 12/29/2023 1619   BUN 24 12/29/2023 1619   CREATININE 0.74 (L) 12/29/2023 1619   CALCIUM 9.4 12/29/2023 1619   PROT 7.1 12/29/2023 1619   ALBUMIN 4.6 12/29/2023 1619   AST 22 12/29/2023 1619   ALT 26 12/29/2023 1619   ALKPHOS 70 12/29/2023 1619   BILITOT 0.8 12/29/2023 1619   GFRNONAA 103 09/13/2014 0943   GFRAA 119 09/13/2014 0943      Latest Ref Rng & Units 12/29/2023    4:19 PM 09/13/2014    9:43 AM  Hepatic Function  Total Protein 6.0 - 8.5 g/dL 7.1  6.9   Albumin 3.8 - 4.9 g/dL 4.6  4.2   AST 0 - 40 IU/L 22  34   ALT 0 - 44 IU/L 26  70   Alk Phosphatase 47 -  123 IU/L 70  62   Total Bilirubin 0.0 - 1.2 mg/dL 0.8  0.8       Review of Systems   All systems reviewed and negative except where noted in HPI.    Physical Exam  BP 125/84   Pulse 71   Temp 98.5 F (36.9 C)   Ht 6' 1 (1.854 m)   Wt 262 lb 6.4 oz (119 kg)   SpO2 95%   BMI 34.62 kg/m  No LMP for male patient. General:   Alert and oriented. Pleasant and cooperative. Well-nourished and well-developed.  No acute distress. Head:  Normocephalic and atraumatic. Eyes:  Without icterus Ears:  Normal auditory acuity. Neck:  Supple; no masses or thyromegaly. Lungs:  Respirations even and unlabored.  Clear throughout to auscultation.   No wheezes, crackles, or rhonchi. No acute distress. Heart:  Regular rate and rhythm; no murmurs, clicks, rubs, or gallops. Abdomen:  Normal bowel sounds.  No bruits.  Soft, non-tender and non-distended without masses, hepatosplenomegaly or hernias noted.  No guarding or rebound tenderness.    Rectal:  Deferred. Msk:  Symmetrical without gross deformities. Normal posture. Extremities:  Without edema. Neurologic:  Alert and  oriented x4;  grossly normal neurologically. Skin:  Intact without significant lesions or rashes. Psych:  Alert and cooperative. Normal mood and affect.   Assessment & Plan   Shaun Hanson is  a 59 y.o. male presenting today with intermittent rectal bleeding.   Intermittent rectal bleeding.  Likely due to internal hemorrhoids, occurs when having constipation.  Last occurrence of bleeding over 2 months ago. - Reinforced high-fiber diet and hydration to minimize straining and hard stools. - Recommended Metamucil for fiber supplementation and Miralax as needed for constipation, especially during travel or post-antibiotic use. - Proceed with planned colonoscopy scheduled for 03/23/2024 to further evaluate rectal bleeding and for surveillance with history of colon polyp.   - Discussed further options of possible hemorrhoid banding if needed in the future.  I discussed the assessment and treatment plan with the patient. The patient was provided an opportunity to ask questions and all were answered. The patient agreed with the plan and demonstrated an understanding of the instructions.   Grayce Bohr, DNP, AGNP-C [facility]  "

## 2024-02-25 ENCOUNTER — Encounter: Payer: Self-pay | Admitting: Family Medicine

## 2024-02-25 ENCOUNTER — Ambulatory Visit: Admitting: Family Medicine

## 2024-02-25 VITALS — BP 125/84 | HR 71 | Temp 98.5°F | Ht 73.0 in | Wt 262.4 lb

## 2024-02-25 DIAGNOSIS — K625 Hemorrhage of anus and rectum: Secondary | ICD-10-CM

## 2024-02-25 DIAGNOSIS — Z860100 Personal history of colon polyps, unspecified: Secondary | ICD-10-CM | POA: Diagnosis not present

## 2024-02-25 NOTE — Patient Instructions (Signed)
 Can take miralax and increase fiber if having constipation when traveling.

## 2024-03-23 ENCOUNTER — Ambulatory Visit
Admission: RE | Admit: 2024-03-23 | Discharge: 2024-03-23 | Disposition: A | Discharge status: Home / Self Care | Attending: Gastroenterology | Admitting: Gastroenterology

## 2024-03-23 ENCOUNTER — Encounter
Admission: RE | Disposition: A | Discharge status: Home / Self Care | Payer: Self-pay | Source: Home / Self Care | Attending: Gastroenterology

## 2024-03-23 ENCOUNTER — Ambulatory Visit: Admitting: Certified Registered Nurse Anesthetist

## 2024-03-23 ENCOUNTER — Encounter: Payer: Self-pay | Admitting: Gastroenterology

## 2024-03-23 DIAGNOSIS — K5730 Diverticulosis of large intestine without perforation or abscess without bleeding: Secondary | ICD-10-CM | POA: Insufficient documentation

## 2024-03-23 DIAGNOSIS — Z860101 Personal history of adenomatous and serrated colon polyps: Secondary | ICD-10-CM | POA: Diagnosis not present

## 2024-03-23 DIAGNOSIS — Z1211 Encounter for screening for malignant neoplasm of colon: Secondary | ICD-10-CM | POA: Insufficient documentation

## 2024-03-23 DIAGNOSIS — K640 First degree hemorrhoids: Secondary | ICD-10-CM | POA: Insufficient documentation

## 2024-03-23 DIAGNOSIS — Z87891 Personal history of nicotine dependence: Secondary | ICD-10-CM | POA: Insufficient documentation

## 2024-03-23 DIAGNOSIS — Z860100 Personal history of colon polyps, unspecified: Secondary | ICD-10-CM

## 2024-03-23 MED ORDER — LACTATED RINGERS IV SOLN: INTRAVENOUS | Status: DC | PRN

## 2024-03-23 MED ORDER — PROPOFOL 500 MG/50ML IV EMUL
INTRAVENOUS | Status: DC | PRN
  Administered 2024-03-23: 140 ug/kg/min via INTRAVENOUS

## 2024-03-23 MED ORDER — SODIUM CHLORIDE 0.9 % IV SOLN
INTRAVENOUS | Status: DC
  Administered 2024-03-23: 500 mL via INTRAVENOUS

## 2024-03-23 MED ORDER — PROPOFOL 1000 MG/100ML IV EMUL
INTRAVENOUS | Status: AC
  Filled 2024-03-23: qty 100

## 2024-03-23 MED ORDER — LIDOCAINE HCL (CARDIAC) PF 100 MG/5ML IV SOSY
PREFILLED_SYRINGE | INTRAVENOUS | Status: DC | PRN
  Administered 2024-03-23: 50 mg via INTRAVENOUS

## 2024-03-23 MED ORDER — PROPOFOL 10 MG/ML IV BOLUS
INTRAVENOUS | Status: DC | PRN
  Administered 2024-03-23: 80 mg via INTRAVENOUS
  Administered 2024-03-23: 20 mg via INTRAVENOUS

## 2024-03-23 NOTE — Anesthesia Preprocedure Evaluation (Addendum)
"                                    Anesthesia Evaluation  Patient identified by MRN, date of birth, ID band Patient awake    Reviewed: Allergy & Precautions, H&P , NPO status , Patient's Chart, lab work & pertinent test results, reviewed documented beta blocker date and time   History of Anesthesia Complications Negative for: history of anesthetic complications  Airway Mallampati: III  TM Distance: >3 FB Neck ROM: full    Dental  (+) Dental Advidsory Given, Caps   Pulmonary former smoker   Pulmonary exam normal breath sounds clear to auscultation       Cardiovascular Exercise Tolerance: Good negative cardio ROS Normal cardiovascular exam Rhythm:regular Rate:Normal     Neuro/Psych negative neurological ROS  negative psych ROS   GI/Hepatic negative GI ROS, Neg liver ROS,,,  Endo/Other  negative endocrine ROS    Renal/GU negative Renal ROS  negative genitourinary   Musculoskeletal   Abdominal   Peds  Hematology negative hematology ROS (+)   Anesthesia Other Findings Past Medical History: 02/20/2024: Abnormal weight gain No date: Complication of anesthesia     Comment:  WOKE UP DURING HERNIA SURGERY 02/20/2024: Compulsive tobacco user syndrome 02/20/2024: Dermatitis, eczematoid 02/20/2024: Dysphonia 02/20/2024: ED (erectile dysfunction) of organic origin 02/20/2024: Edema leg No date: Erectile dysfunction 02/20/2024: Hordeolum externum 02/20/2024: Infection of the upper respiratory tract 02/20/2024: Inflammation of urethra No date: Leg edema, right   Reproductive/Obstetrics negative OB ROS                              Anesthesia Physical Anesthesia Plan  ASA: 2  Anesthesia Plan: General   Post-op Pain Management:    Induction: Intravenous  PONV Risk Score and Plan: 2 and Propofol  infusion, TIVA and Treatment may vary due to age or medical condition  Airway Management Planned: Natural Airway and Nasal  Cannula  Additional Equipment:   Intra-op Plan:   Post-operative Plan:   Informed Consent: I have reviewed the patients History and Physical, chart, labs and discussed the procedure including the risks, benefits and alternatives for the proposed anesthesia with the patient or authorized representative who has indicated his/her understanding and acceptance.     Dental Advisory Given  Plan Discussed with: Anesthesiologist, CRNA and Surgeon  Anesthesia Plan Comments:          Anesthesia Quick Evaluation  "

## 2024-03-23 NOTE — H&P (Signed)
 "  Rogelia Copping, MD [facility] [address] [place], [place] [address] Phone:[phone] Fax : [phone]  Primary Care Physician:  Donzella Lauraine SAILOR, DO Primary Gastroenterologist:  Dr. Copping  Pre-Procedure History & Physical: HPI:  Shaun Hanson is a 59 y.o. male is here for an colonoscopy.   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   Edema leg 02/20/2024   Erectile dysfunction    Hordeolum externum 02/20/2024   Infection of the upper respiratory tract 02/20/2024   Inflammation of urethra 02/20/2024   Leg edema, right     Past Surgical History:  Procedure Laterality Date   CERVICAL SPINE SURGERY     COLONOSCOPY WITH PROPOFOL  N/A 10/10/2014   Procedure: COLONOSCOPY WITH PROPOFOL ;  Surgeon: Rogelia Copping, MD;  Location: [facility];  Service: Endoscopy;  Laterality: N/A;   HERNIA REPAIR     UMBILICAL SURGERY   POLYPECTOMY N/A 10/10/2014   Procedure: POLYPECTOMY;  Surgeon: Rogelia Copping, MD;  Location: [facility];  Service: Endoscopy;  Laterality: N/A;  transverse colon polyp    Prior to Admission medications  Medication Sig Start Date End Date Taking? Authorizing Provider  furosemide  (LASIX ) 20 MG tablet Take 1 tablet (20 mg total) by mouth daily as needed. 12/29/23  Yes Pardue, Lauraine SAILOR, DO  tadalafil  (CIALIS ) 20 MG tablet Take 1 tablet (20 mg total) by mouth daily as needed for erectile dysfunction. 09/01/14   Chauvin, Alpha, PA    Allergies as of 01/09/2024 - Review Complete 01/04/2024  Allergen Reaction Noted   Bee venom Swelling 12/29/2023    History reviewed. No pertinent family history.  Social History   Socioeconomic History   Marital status: Single    Spouse name: Not on file   Number of  children: Not on file   Years of education: Not on file   Highest education level: Not on file  Occupational History   Not on file  Tobacco Use   Smoking status: Former    Current packs/day: 0.50    Average packs/day: 0.5 packs/day for 10.0 years (5.0 ttl pk-yrs)    Types: Cigarettes   Smokeless tobacco: Never  Vaping Use   Vaping status: Never Used  Substance and Sexual Activity   Alcohol use: Yes    Alcohol/week: 0.0 standard drinks of alcohol    Comment: past history of heavy alcohol use, patient was on 20oz a night and is now to a drink over weekend.    Drug use: Never   Sexual activity: Not on file  Other Topics Concern   Not on file  Social History Narrative   Not on file   Social Drivers of Health   Tobacco Use: Medium Risk (03/23/2024)   Patient History    Smoking Tobacco Use: Former    Smokeless Tobacco Use: Never    Passive Exposure: Not on file  Financial Resource Strain: Not on file  Food Insecurity: Not on file  Transportation Needs: Not on file  Physical Activity: Not on file  Stress: Not on file  Social Connections: Not on file  Intimate Partner Violence: Not on file  Depression (EYV7-0): Not on file  Alcohol Screen: Medium Risk (12/29/2023)   Alcohol Screen    Last Alcohol Screening Score (AUDIT):  13  Housing: Not on file  Utilities: Not on file  Health Literacy: Not on file    Review of Systems: See HPI, otherwise negative ROS  Physical Exam: BP 133/87   Pulse 70   Temp (!) 97.3 F (36.3 C) (Temporal)   Resp 18   Ht 6' (1.829 m)   Wt 115.3 kg   SpO2 97%   BMI 34.48 kg/m  General:   Alert,  pleasant and cooperative in NAD Head:  Normocephalic and atraumatic. Neck:  Supple; no masses or thyromegaly. Lungs:  Clear throughout to auscultation.    Heart:  Regular rate and rhythm. Abdomen:  Soft, nontender and nondistended. Normal bowel sounds, without guarding, and without rebound.   Neurologic:  Alert and  oriented x4;  grossly normal  neurologically.  Impression/Plan: [name] is here for an colonoscopy to be performed for a history of adenomatous polyps on 2016   Risks, benefits, limitations, and alternatives regarding  colonoscopy have been reviewed with the patient.  Questions have been answered.  All parties agreeable.   Rogelia Copping, MD  03/23/2024, 7:19 AM "

## 2024-07-19 ENCOUNTER — Ambulatory Visit: Admitting: Family Medicine

## 2024-12-29 ENCOUNTER — Encounter: Admitting: Family Medicine
